# Patient Record
Sex: Female | Born: 1970
Health system: Southern US, Community
[De-identification: ages and names within clinical notes are randomized; demographics above are authoritative.]

## PROBLEM LIST (undated history)

## (undated) DIAGNOSIS — G43509 Persistent migraine aura without cerebral infarction, not intractable, without status migrainosus: Secondary | ICD-10-CM

## (undated) DIAGNOSIS — C73 Malignant neoplasm of thyroid gland: Secondary | ICD-10-CM

## (undated) HISTORY — DX: Persistent migraine aura without cerebral infarction, not intractable, without status migrainosus: G43.509

## (undated) HISTORY — DX: Malignant neoplasm of thyroid gland: C73

---
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
## 1997-10-25 ENCOUNTER — Other Ambulatory Visit: Admission: RE | Admit: 1997-10-25 | Discharge: 1997-10-25 | Payer: Self-pay | Admitting: Obstetrics and Gynecology

## 1998-11-05 ENCOUNTER — Other Ambulatory Visit: Admission: RE | Admit: 1998-11-05 | Discharge: 1998-11-05 | Payer: Self-pay | Admitting: Obstetrics and Gynecology

## 1999-11-18 ENCOUNTER — Other Ambulatory Visit: Admission: RE | Admit: 1999-11-18 | Discharge: 1999-11-18 | Payer: Self-pay | Admitting: Obstetrics and Gynecology

## 2000-11-23 ENCOUNTER — Other Ambulatory Visit: Admission: RE | Admit: 2000-11-23 | Discharge: 2000-11-23 | Payer: Self-pay | Admitting: Obstetrics and Gynecology

## 2001-08-20 ENCOUNTER — Encounter: Admission: RE | Admit: 2001-08-20 | Discharge: 2001-08-20 | Payer: Self-pay | Admitting: Internal Medicine

## 2001-08-20 ENCOUNTER — Encounter: Payer: Self-pay | Admitting: Internal Medicine

## 2002-06-29 ENCOUNTER — Encounter: Admission: RE | Admit: 2002-06-29 | Discharge: 2002-06-29 | Payer: Self-pay | Admitting: Internal Medicine

## 2002-06-29 ENCOUNTER — Encounter: Payer: Self-pay | Admitting: Internal Medicine

## 2004-04-03 ENCOUNTER — Ambulatory Visit: Payer: Self-pay | Admitting: Internal Medicine

## 2004-08-21 ENCOUNTER — Ambulatory Visit: Payer: Self-pay | Admitting: Internal Medicine

## 2005-03-01 ENCOUNTER — Emergency Department (HOSPITAL_COMMUNITY): Admission: EM | Admit: 2005-03-01 | Discharge: 2005-03-01 | Payer: Self-pay | Admitting: Family Medicine

## 2006-04-02 ENCOUNTER — Inpatient Hospital Stay (HOSPITAL_COMMUNITY): Admission: AD | Admit: 2006-04-02 | Discharge: 2006-04-02 | Payer: Self-pay | Admitting: Internal Medicine

## 2006-05-22 ENCOUNTER — Emergency Department (HOSPITAL_COMMUNITY): Admission: EM | Admit: 2006-05-22 | Discharge: 2006-05-22 | Payer: Self-pay | Admitting: Family Medicine

## 2006-06-30 ENCOUNTER — Emergency Department (HOSPITAL_COMMUNITY): Admission: EM | Admit: 2006-06-30 | Discharge: 2006-06-30 | Payer: Self-pay | Admitting: Family Medicine

## 2006-07-24 ENCOUNTER — Ambulatory Visit: Payer: Self-pay | Admitting: Internal Medicine

## 2006-09-17 ENCOUNTER — Inpatient Hospital Stay (HOSPITAL_COMMUNITY): Admission: AD | Admit: 2006-09-17 | Discharge: 2006-09-21 | Payer: Self-pay | Admitting: *Deleted

## 2006-10-05 ENCOUNTER — Encounter: Payer: Self-pay | Admitting: Internal Medicine

## 2007-02-09 ENCOUNTER — Ambulatory Visit: Payer: Self-pay | Admitting: Internal Medicine

## 2007-02-09 DIAGNOSIS — J019 Acute sinusitis, unspecified: Secondary | ICD-10-CM | POA: Insufficient documentation

## 2007-02-09 DIAGNOSIS — H60509 Unspecified acute noninfective otitis externa, unspecified ear: Secondary | ICD-10-CM | POA: Insufficient documentation

## 2007-05-20 HISTORY — PX: THYROIDECTOMY: SHX17

## 2007-06-03 ENCOUNTER — Ambulatory Visit: Payer: Self-pay | Admitting: Internal Medicine

## 2007-06-03 DIAGNOSIS — E041 Nontoxic single thyroid nodule: Secondary | ICD-10-CM | POA: Insufficient documentation

## 2007-06-11 ENCOUNTER — Encounter: Admission: RE | Admit: 2007-06-11 | Discharge: 2007-06-11 | Payer: Self-pay | Admitting: Internal Medicine

## 2007-06-15 ENCOUNTER — Encounter (INDEPENDENT_AMBULATORY_CARE_PROVIDER_SITE_OTHER): Payer: Self-pay | Admitting: *Deleted

## 2007-07-01 ENCOUNTER — Ambulatory Visit: Payer: Self-pay | Admitting: Internal Medicine

## 2007-07-01 DIAGNOSIS — G43909 Migraine, unspecified, not intractable, without status migrainosus: Secondary | ICD-10-CM | POA: Insufficient documentation

## 2007-07-01 DIAGNOSIS — R5381 Other malaise: Secondary | ICD-10-CM | POA: Insufficient documentation

## 2007-07-01 DIAGNOSIS — R5383 Other fatigue: Secondary | ICD-10-CM

## 2007-07-01 DIAGNOSIS — I951 Orthostatic hypotension: Secondary | ICD-10-CM | POA: Insufficient documentation

## 2007-07-01 DIAGNOSIS — E162 Hypoglycemia, unspecified: Secondary | ICD-10-CM | POA: Insufficient documentation

## 2007-07-02 ENCOUNTER — Ambulatory Visit: Payer: Self-pay | Admitting: Internal Medicine

## 2007-07-03 LAB — CONVERTED CEMR LAB
AST: 17 units/L (ref 0–37)
Albumin: 3.8 g/dL (ref 3.5–5.2)
Basophils Absolute: 0 10*3/uL (ref 0.0–0.1)
Chloride: 108 meq/L (ref 96–112)
Creatinine, Ser: 0.9 mg/dL (ref 0.4–1.2)
Eosinophils Relative: 1.3 % (ref 0.0–5.0)
Glucose, Bld: 86 mg/dL (ref 70–99)
HCT: 38.5 % (ref 36.0–46.0)
Hgb A1c MFr Bld: 5.5 % (ref 4.6–6.0)
Neutrophils Relative %: 45.3 % (ref 43.0–77.0)
RBC: 4.22 M/uL (ref 3.87–5.11)
RDW: 12.3 % (ref 11.5–14.6)
Sodium: 141 meq/L (ref 135–145)
Total Bilirubin: 0.7 mg/dL (ref 0.3–1.2)
WBC: 7.7 10*3/uL (ref 4.5–10.5)

## 2007-07-05 ENCOUNTER — Encounter (INDEPENDENT_AMBULATORY_CARE_PROVIDER_SITE_OTHER): Payer: Self-pay | Admitting: *Deleted

## 2007-07-08 ENCOUNTER — Encounter: Admission: RE | Admit: 2007-07-08 | Discharge: 2007-07-08 | Payer: Self-pay | Admitting: Internal Medicine

## 2007-07-12 ENCOUNTER — Encounter (INDEPENDENT_AMBULATORY_CARE_PROVIDER_SITE_OTHER): Payer: Self-pay | Admitting: *Deleted

## 2007-07-13 ENCOUNTER — Telehealth (INDEPENDENT_AMBULATORY_CARE_PROVIDER_SITE_OTHER): Payer: Self-pay | Admitting: *Deleted

## 2007-07-20 ENCOUNTER — Encounter: Payer: Self-pay | Admitting: Internal Medicine

## 2007-08-18 ENCOUNTER — Encounter (INDEPENDENT_AMBULATORY_CARE_PROVIDER_SITE_OTHER): Payer: Self-pay | Admitting: Surgery

## 2007-08-18 ENCOUNTER — Ambulatory Visit (HOSPITAL_COMMUNITY): Admission: RE | Admit: 2007-08-18 | Discharge: 2007-08-19 | Payer: Self-pay | Admitting: Surgery

## 2007-08-23 ENCOUNTER — Telehealth (INDEPENDENT_AMBULATORY_CARE_PROVIDER_SITE_OTHER): Payer: Self-pay | Admitting: *Deleted

## 2007-09-14 ENCOUNTER — Encounter: Admission: RE | Admit: 2007-09-14 | Discharge: 2007-09-14 | Payer: Self-pay | Admitting: Endocrinology

## 2007-09-15 ENCOUNTER — Encounter: Payer: Self-pay | Admitting: Internal Medicine

## 2007-09-21 ENCOUNTER — Encounter: Admission: RE | Admit: 2007-09-21 | Discharge: 2007-09-21 | Payer: Self-pay | Admitting: Endocrinology

## 2007-09-28 ENCOUNTER — Encounter: Admission: RE | Admit: 2007-09-28 | Discharge: 2007-09-28 | Payer: Self-pay | Admitting: Endocrinology

## 2007-10-26 ENCOUNTER — Encounter: Payer: Self-pay | Admitting: Internal Medicine

## 2008-04-10 ENCOUNTER — Ambulatory Visit: Payer: Self-pay | Admitting: Internal Medicine

## 2008-05-08 ENCOUNTER — Encounter: Payer: Self-pay | Admitting: Internal Medicine

## 2008-07-06 ENCOUNTER — Ambulatory Visit: Payer: Self-pay | Admitting: Family Medicine

## 2009-02-05 ENCOUNTER — Encounter (HOSPITAL_COMMUNITY): Admission: RE | Admit: 2009-02-05 | Discharge: 2009-02-09 | Payer: Self-pay | Admitting: Endocrinology

## 2009-07-11 ENCOUNTER — Telehealth: Payer: Self-pay | Admitting: Internal Medicine

## 2009-10-02 ENCOUNTER — Ambulatory Visit: Payer: Self-pay | Admitting: Nurse Practitioner

## 2009-12-04 ENCOUNTER — Ambulatory Visit: Payer: Self-pay | Admitting: Nurse Practitioner

## 2010-03-12 ENCOUNTER — Ambulatory Visit: Payer: Self-pay | Admitting: Nurse Practitioner

## 2010-04-24 ENCOUNTER — Ambulatory Visit: Payer: Self-pay | Admitting: Internal Medicine

## 2010-06-20 NOTE — Progress Notes (Signed)
Summary: new med  Phone Note Call from Patient Call back at Home Phone [phone]   Caller: Patient Summary of Call: pt states that she saw her psychologists Milford Cage [phone](cell) who suggest that she contact dr Jasmine Maceachern to have him rx wellbutrin XL 150mg . per pt if dr Lielle Vandervort has any question please contact Britta Mccreedy at above number. pt uses [facility]. pls advise ok with rx this med or would you prefer to see pt first..................Marland KitchenFelecia Deloach CMA  July 11, 2009 10:13 AM  Initial call taken by: Jeremy Johann CMA,  July 11, 2009 10:09 AM  Follow-up for Phone Call        Generic OK #30, RX2 Follow-up by: Marga Melnick MD,  July 11, 2009 2:44 PM  Additional Follow-up for Phone Call Additional follow up Details #1::        left pt detail message rx sent to pharmacy.......Marland KitchenFelecia Deloach CMA  July 11, 2009 2:59 PM     New/Updated Medications: WELLBUTRIN XL 150 MG XR24H-TAB (BUPROPION HCL) Take 1 tab once daily Prescriptions: WELLBUTRIN XL 150 MG XR24H-TAB (BUPROPION HCL) Take 1 tab once daily  #30 x 2   Entered by:   Jeremy Johann CMA   Authorized by:   Marga Melnick MD   Signed by:   Jeremy Johann CMA on 07/11/2009   Method used:   Faxed to ...       [facility] (retail)       [address]       [place], [place]  [address]       Ph: [phone] or [phone]       Fax: [phone]   RxID:   [id number]

## 2010-06-20 NOTE — Assessment & Plan Note (Signed)
Summary: congested/cbs   Vital Signs:  Patient profile:   40 year old female Height:      68 inches Weight:      175.4 pounds BMI:     26.77 Temp:     98.7 degrees F oral Pulse rate:   76 / minute Resp:     16 per minute BP sitting:   110 / 68  (left arm) Cuff size:   large  Vitals Entered By: Shonna Chock CMA (April 24, 2010 9:27 AM) CC: X 3 days- congestion, patient states "It feels like something is sitting on my face." Patient with frequent travel with in the past week ( on about 8 different planes) , URI symptoms   CC:  X 3 days- congestion, patient states "It feels like something is sitting on my face." Patient with frequent travel with in the past week ( on about 8 different planes) , and URI symptoms.  History of Present Illness:      This is a 40 year old woman who presents with  RTI  symptoms; onset 12/3 as congestion.  The patient now reports nasal congestion, purulent nasal discharge, and productive cough, but denies sore throat and earache ( but she has pressure especially with frequent flights).  Associated symptoms include wheezing climbing stairs.  The patient denies fever.  The patient also reports frontal  headache.  & bilateral facial pain and tooth pain.  The patient denies the following risk factors for Strep sinusitis: tender adenopathy.  Rx: Sudafed, Mucinex, Neti pot   Current Medications (verified): 1)  Topamax 200 Mg Tabs (Topiramate) .Marland Kitchen.. 1 By Mouth Once Daily 2)  Seasonique 0.15-0.03 &0.01 Mg Tabs (Levonorgest-Eth Estrad 91-Day) .Marland Kitchen.. 1 By Mouth Once Daily 3)  Synthroid 175 Mcg Tabs (Levothyroxine Sodium) .Marland Kitchen.. 1 By Mouth Once Daily 4)  Wellbutrin Xl 150 Mg Xr24h-Tab (Bupropion Hcl) .... Take 1 Tab Once Daily 5)  Treximet 85-500 Mg Tabs (Sumatriptan-Naproxen Sodium) .... As Needed For Migraines  Allergies: 1)  ! Percocet  Physical Exam  General:  Appears tired but in no acute distress; alert,appropriate and cooperative throughout examination Ears:   External ear exam shows no significant lesions or deformities.  Otoscopic examination reveals clear canals, tympanic membranes are intact bilaterally without bulging, retraction, inflammation or discharge. Hearing is grossly normal bilaterally. Nose:  External nasal examination shows no deformity or inflammation. Nasal mucosa are pink and moist without lesions or exudates.Repeatedly sniffling Mouth:  Oral mucosa and oropharynx without lesions or exudates.  Teeth in good repair. Lungs:  Normal respiratory effort, chest expands symmetrically. Lungs are clear to auscultation, no crackles or wheezes. Cervical Nodes:  No lymphadenopathy noted Axillary Nodes:  No palpable lymphadenopathy   Impression & Recommendations:  Problem # 1:  BRONCHITIS-ACUTE (ICD-466.0)  Her updated medication list for this problem includes:    Amoxicillin-pot Clavulanate 875-125 Mg Tabs (Amoxicillin-pot clavulanate) .Marland Kitchen... 1 every 12 hrs with a meal  Problem # 2:  SINUSITIS- ACUTE-NOS (ICD-461.9)  The following medications were removed from the medication list:    Nasonex 50 Mcg/act Susp (Mometasone furoate) .Marland Kitchen... 2 sprays each nostril once daily Her updated medication list for this problem includes:    Amoxicillin-pot Clavulanate 875-125 Mg Tabs (Amoxicillin-pot clavulanate) .Marland Kitchen... 1 every 12 hrs with a meal    Fluticasone Propionate 50 Mcg/act Susp (Fluticasone propionate) .Marland Kitchen... 1 spray two times a day as needed  Complete Medication List: 1)  Topamax 200 Mg Tabs (Topiramate) .Marland Kitchen.. 1 by mouth once daily 2)  Seasonique 0.15-0.03 &0.01 Mg Tabs (Levonorgest-eth estrad 91-day) .Marland Kitchen.. 1 by mouth once daily 3)  Synthroid 175 Mcg Tabs (Levothyroxine sodium) .Marland Kitchen.. 1 by mouth once daily 4)  Wellbutrin Xl 150 Mg Xr24h-tab (Bupropion hcl) .... Take 1 tab once daily 5)  Treximet 85-500 Mg Tabs (Sumatriptan-naproxen sodium) .... As needed for migraines 6)  Amoxicillin-pot Clavulanate 875-125 Mg Tabs (Amoxicillin-pot clavulanate) .Marland Kitchen..  1 every 12 hrs with a meal 7)  Fluticasone Propionate 50 Mcg/act Susp (Fluticasone propionate) .Marland Kitchen.. 1 spray two times a day as needed  Patient Instructions: 1)  Neti pot once daily two times a day as needed until clear. 2)  Drink as much NON dairy fluid as you can tolerate for the next few days. Prescriptions: FLUTICASONE PROPIONATE 50 MCG/ACT SUSP (FLUTICASONE PROPIONATE) 1 spray two times a day as needed  #1 x 11   Entered and Authorized by:   Marga Melnick MD   Signed by:   Marga Melnick MD on 04/24/2010   Method used:   Faxed to ...       [facility] (retail)       [address]       [place], [place]  [address]       Ph: [phone] or [phone]       Fax: [phone]   RxID:   [id number] AMOXICILLIN-POT CLAVULANATE 875-125 MG TABS (AMOXICILLIN-POT CLAVULANATE) 1 every 12 hrs with a meal  #20 x 0   Entered and Authorized by:   Marga Melnick MD   Signed by:   Marga Melnick MD on 04/24/2010   Method used:   Faxed to ...       [facility] (retail)       [address]       [place], [place]  [address]       Ph: [phone] or [phone]       Fax: [phone]   RxID:   [id number]    Orders Added: 1)  Est. Patient Level III [50093]

## 2010-07-16 ENCOUNTER — Encounter: Payer: Self-pay | Admitting: Internal Medicine

## 2010-07-16 ENCOUNTER — Ambulatory Visit (INDEPENDENT_AMBULATORY_CARE_PROVIDER_SITE_OTHER): Payer: BC Managed Care – PPO | Admitting: Internal Medicine

## 2010-07-16 DIAGNOSIS — J069 Acute upper respiratory infection, unspecified: Secondary | ICD-10-CM

## 2010-07-16 DIAGNOSIS — J209 Acute bronchitis, unspecified: Secondary | ICD-10-CM

## 2010-07-19 ENCOUNTER — Telehealth (INDEPENDENT_AMBULATORY_CARE_PROVIDER_SITE_OTHER): Payer: Self-pay | Admitting: *Deleted

## 2010-07-24 ENCOUNTER — Telehealth (INDEPENDENT_AMBULATORY_CARE_PROVIDER_SITE_OTHER): Payer: Self-pay | Admitting: *Deleted

## 2010-07-25 NOTE — Progress Notes (Signed)
Summary: med for cough  Phone Note Call from Patient Call back at Home Phone [phone] [facility]     Caller: Patient Summary of Call: Pt calling was seen 07/16/10 would like to have something called into pharmacy for cough has been up all night otc meds not helping.  pharmacy [facility] Initial call taken by: Doristine Devoid CMA,  July 19, 2010 12:50 PM  Follow-up for Phone Call        allergic to Percocet ; therefore generic Tessalon 200q 6-8 hrs as needed #20 & Prednisone 20 mg two times a day with meals #10 Follow-up by: Marga Melnick MD,  July 19, 2010 12:59 PM  Additional Follow-up for Phone Call Additional follow up Details #1::        spoke w/ patient aware prescription sent to pharmacy .Marland KitchenMarland KitchenMarland KitchenDoristine Devoid CMA  July 19, 2010 3:42 PM     New/Updated Medications: TESSALON 200 MG CAPS (BENZONATATE) take one every 6-8 hours as needed for cough PREDNISONE 20 MG TABS (PREDNISONE) take one two times a day with meals Prescriptions: PREDNISONE 20 MG TABS (PREDNISONE) take one two times a day with meals  #10 x 0   Entered by:   Doristine Devoid CMA   Authorized by:   Marga Melnick MD   Signed by:   Doristine Devoid CMA on 07/19/2010   Method used:   Electronically to        [facility] (retail)       [address]       [place], [place]  [address]       Ph: [phone] or [phone]       Fax: [phone]   RxID:   [id number] TESSALON 200 MG CAPS (BENZONATATE) take one every 6-8 hours as needed for cough  #20 x 0   Entered by:   Doristine Devoid CMA   Authorized by:   Marga Melnick MD   Signed by:   Doristine Devoid CMA on 07/19/2010   Method used:   Electronically to        [facility] (retail)       [address]       [place], [place]  [address]       Ph: [phone] or [phone]       Fax: [phone]   RxID:   [id number]

## 2010-07-25 NOTE — Progress Notes (Signed)
Summary: Triage: Coughed all night  Phone Note Call from Patient Call back at Home Phone [phone] P PH     Caller: Patient Summary of Call: Patient left message on voicemail stating she needs something else for cough because she coughed so much last night she is completely exhausted.   Per Dr.Hopper with percocet allergy unable to rx other cough suppresants.   I asked patient what was her reaction to percocet and she stated: "It made me throw up but im willing to try something containing the same ingredients because Im exhausted from coughing and I need something."  Per Dr.Hopper we will rx Hydromet   New Allergies: ! PERCOCET New/Updated Medications: HYDROMET 5-1.5 MG/5ML SYRP (HYDROCODONE-HOMATROPINE) 1 teaspoon every 6 hours as needed New Allergies: ! PERCOCETPrescriptions: HYDROMET 5-1.5 MG/5ML SYRP (HYDROCODONE-HOMATROPINE) 1 teaspoon every 6 hours as needed  #120cc x 0   Entered by:   Shonna Chock CMA   Authorized by:   Marga Melnick MD   Signed by:   Shonna Chock CMA on 07/19/2010   Method used:   Printed then faxed to ...       [facility] (retail)       [address]       [place], [place]  [address]       Ph: [phone] or [phone]       Fax: [phone]   RxID:   [id number]

## 2010-07-25 NOTE — Assessment & Plan Note (Signed)
Summary: COUGH FOR OVER 10 DAYS///SPH   Vital Signs:  Patient profile:   40 year old female Height:      68 inches (172.72 cm) Weight:      180.38 pounds (81.99 kg) BMI:     27.53 Temp:     98.2 degrees F (36.78 degrees C) oral Resp:     15 per minute BP sitting:   110 / 50  (left arm) Cuff size:   large  Vitals Entered By: Lucious Groves CMA (July 16, 2010 12:17 PM) CC: C/O cough and upper respiratory symptoms for 3 weeks./kb, URI symptoms Comments Patient notes that she has been having fatigue, congestion, cough with dark green mucous production, and HA. She denies fever and chest pain.   CC:  C/O cough and upper respiratory symptoms for 3 weeks./kb and URI symptoms.  History of Present Illness:    Onset 3 weeks ago as malaise & head congestion. She now  reports nasal congestion and productive cough, but denies purulent nasal discharge and earache.  Associated symptoms include dyspnea and wheezing.  The patient denies fever.  The patient denies frontal  headache.  Risk factors for Strep sinusitis include unilateral facial pain.  The patient denies the following risk factors for Strep sinusitis: tooth pain and tender adenopathy.  Rx: Neti pot, Mucinex  Current Medications (verified): 1)  Topamax 200 Mg Tabs (Topiramate) .Marland Kitchen.. 1 By Mouth Once Daily 2)  Seasonique 0.15-0.03 &0.01 Mg Tabs (Levonorgest-Eth Estrad 91-Day) .Marland Kitchen.. 1 By Mouth Once Daily 3)  Synthroid 175 Mcg Tabs (Levothyroxine Sodium) .Marland Kitchen.. 1 By Mouth Once Daily 4)  Treximet 85-500 Mg Tabs (Sumatriptan-Naproxen Sodium) .... As Needed For Migraines 5)  Amoxicillin-Pot Clavulanate 875-125 Mg Tabs (Amoxicillin-Pot Clavulanate) .Marland Kitchen.. 1 Every 12 Hrs With A Meal  Allergies (verified): 1)  ! Percocet  Physical Exam  General:  well-nourished,in no acute distress; alert,appropriate and cooperative throughout examination Ears:  External ear exam shows no significant lesions or deformities.  Otoscopic examination reveals clear  canals, tympanic membranes are intact bilaterally without bulging, retraction, inflammation or discharge. Hearing is grossly normal bilaterally. Nose:  External nasal examination shows no deformity or inflammation. Nasal mucosa are  dry  without lesions or exudates. Hyponasal speech Mouth:  Oral mucosa and oropharynx without lesions or exudates.  Teeth in good repair. Neck:  No deformities, masses, or tenderness noted. Thyroid absent Lungs:  Normal respiratory effort, chest expands symmetrically. Lungs : minimal rhonchi; no  wheezes. Cervical Nodes:  No lymphadenopathy noted Axillary Nodes:  No palpable lymphadenopathy   Impression & Recommendations:  Problem # 1:  BRONCHITIS-ACUTE (ICD-466.0)  Her updated medication list for this problem includes:    Amoxicillin-pot Clavulanate 875-125 Mg Tabs (Amoxicillin-pot clavulanate) .Marland Kitchen... 1 every 12 hrs with a meal    Cefuroxime Axetil 500 Mg Tabs (Cefuroxime axetil) .Marland Kitchen... 1 two times a day  Problem # 2:  URI (ICD-465.9)  Complete Medication List: 1)  Topamax 200 Mg Tabs (Topiramate) .Marland Kitchen.. 1 by mouth once daily 2)  Seasonique 0.15-0.03 &0.01 Mg Tabs (Levonorgest-eth estrad 91-day) .Marland Kitchen.. 1 by mouth once daily 3)  Synthroid 175 Mcg Tabs (Levothyroxine sodium) .Marland Kitchen.. 1 by mouth once daily 4)  Treximet 85-500 Mg Tabs (Sumatriptan-naproxen sodium) .... As needed for migraines 5)  Amoxicillin-pot Clavulanate 875-125 Mg Tabs (Amoxicillin-pot clavulanate) .Marland Kitchen.. 1 every 12 hrs with a meal 6)  Cefuroxime Axetil 500 Mg Tabs (Cefuroxime axetil) .Marland Kitchen.. 1 two times a day 7)  Fluticasone Propionate 50 Mcg/act Susp (Fluticasone propionate) .Marland Kitchen.. 1 spray  two times a day  Patient Instructions: 1)  Drink as much  non DAIRY fluid as you can tolerate for the next few days. Neti pot once daily two times a day as needed . Prescriptions: FLUTICASONE PROPIONATE 50 MCG/ACT SUSP (FLUTICASONE PROPIONATE) 1 spray two times a day  #1 x 11   Entered and Authorized by:   Marga Melnick MD   Signed by:   Marga Melnick MD on 07/16/2010   Method used:   Electronically to        [facility] (retail)       [address]       [place], [place]  [address]       Ph: [phone] or [phone]       Fax: [phone]   RxID:   [id number] CEFUROXIME AXETIL 500 MG TABS (CEFUROXIME AXETIL) 1 two times a day  #20 x 0   Entered and Authorized by:   Marga Melnick MD   Signed by:   Marga Melnick MD on 07/16/2010   Method used:   Electronically to        [facility] (retail)       [address]       [place], [place]  [id number]       Ph: [phone] or [phone]       Fax: [phone]   RxID:   [id number]    Orders Added: 1)  Est. Patient Level III [[address]678]

## 2010-07-30 NOTE — Progress Notes (Signed)
Summary: refill  Phone Note Refill Request Message from:  Fax from Pharmacy on July 24, 2010 1:55 PM  Refills Requested: Medication #1:  HYDROMET 5-1.5 MG/5ML SYRP 1 teaspoon every 6 hours as needed. [facility] [address] - fax [phone]  Initial call taken by: Okey Regal [place],  July 24, 2010 1:56 PM  Follow-up for Phone Call        Dr.Hopper please advise Follow-up by: Shonna Chock CMA,  July 24, 2010 2:03 PM  Additional Follow-up for Phone Call Additional follow up Details #1::        OK X 1 Additional Follow-up by: Marga Melnick MD,  July 25, 2010 4:52 AM    Prescriptions: HYDROMET 5-1.5 MG/5ML SYRP (HYDROCODONE-HOMATROPINE) 1 teaspoon every 6 hours as needed  #120cc x 0   Entered by:   Shonna Chock CMA   Authorized by:   Marga Melnick MD   Signed by:   Shonna Chock CMA on 07/25/2010   Method used:   Printed then faxed to ...       [facility] (retail)       [address]       [place], [place]  [address]       Ph: [phone] or [phone]       Fax: [phone]   RxID:   [id number]

## 2010-09-03 ENCOUNTER — Encounter: Payer: BC Managed Care – PPO | Admitting: Nurse Practitioner

## 2010-09-03 DIAGNOSIS — G43109 Migraine with aura, not intractable, without status migrainosus: Secondary | ICD-10-CM

## 2010-09-04 NOTE — Assessment & Plan Note (Unsigned)
NAMEKELLY, RANIERI NO.:  [id number]  MEDICAL RECORD NO.:  [id number]           PATIENT TYPE:  LOCATION:  [facility] at [facility]           FACILITY:  PHYSICIAN:  Catalina Antigua, MD     DATE OF BIRTH:  [birth year]  DATE OF SERVICE:  09/03/2010                                 CLINIC NOTE  The patient comes to the office today for followup on her migraine headaches.  The patient was last seen approximately 6 months ago.  Since that time, she has actually been doing very well with her headaches. She has been skipping her placebo on her Seasonique and not been having any menstrual cycle and that has been helping.  She has changed doctors from Dr. Clearance Coots and is having someone else follow her for her thyroid problems.  PHYSICAL EXAMINATION:  VITAL SIGNS:  Blood pressure is 104/70, weight is 184, height is 5 feet 8 inches, pulse is 77. GENERAL:  A well-developed, well-nourished Caucasian female, in no acute distress. HEENT:  Head is normocephalic and atraumatic.  Pupils equal and reactive. NEUROLOGIC:  The patient is alert and oriented.  Good muscle tone, good coordination, and good sensation.  ASSESSMENT:  Migraine headache.  PLAN:  We will refill her medications, Topamax, Treximet, and Vicodin for rescue and prednisone for rescue.  She will return to the office in 1 year or sooner as needed.     Remonia Richter, NP   ______________________________ Catalina Antigua, MD   LR/MEDQ  D:  09/03/2010  T:  09/04/2010  Job:  [id number]
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
## 2010-10-01 NOTE — Assessment & Plan Note (Signed)
Sheila Curtis, Sheila Curtis NO.:  [id number]   MEDICAL RECORD NO.:  [id number]          PATIENT TYPE:  POB   LOCATION:  [facility] at [facility]         FACILITY:  [facility]   PHYSICIAN:  Jaynie Collins, MD     DATE OF BIRTH:  20-Aug-1970   DATE OF SERVICE:  10/02/2009                                  CLINIC NOTE   The patient comes to the office today for consultation on her migraine  headache.  The patient is well-known to me from the [facility].  She has been seen at the [facility] for 10+  years.  She has recently become unhappy with her care there and would  like to switch.  She did have a baby in 2008, following that had some  difficulties and was diagnosed with thyroid cancer in 2009.  She has had  some trouble with her weight.  She does see Dr. Talmage Nap for Endocrinology.  Her last TSH was in the 1 range which she is happy with.  She currently  has been experiencing migraines since she was 40 years old.  She does  not have aura.  Her headaches are usually in one temple or the other.  She has a unique headache pattern that includes having 1-2 prolonged  headaches per month that lasts 3-4 days.  These wax and wane from  serious to mild to moderate.  She was recently put on Zonegran 100 mg  for headache prevention.  She had been on Topamax in the past prior to  being pregnant and she was not sure why she did not put back on that  after delivery.  She is currently on Wellbutrin 150 mg for depression.  When she gets a headache, she has been taking the needleless sumatriptan  injection, Imitrex tablets, DHE nasal spray, and Flexeril.  She  recognizes that her triggers are stress.  She has a full-time job and a  68-year-old.  Her husband is very helpful around the house and she does  have good support there.  She also recognizes smells and alcohol can  trigger a migraine.  She is sleeping very well.  She has been using  Seasonique for her menstrual cycle  control and has been on that for  years.   OBSTETRICAL HISTORY:  G1, P1.   SURGICAL HISTORY:  In 1992, breast reduction.  In 2008, C-section.  In  2009, thyroidectomy.   FAMILY HISTORY:  Diabetes, grandmother.  High blood pressure, mother.  Cancer, grandmother.   PERSONAL MEDICAL HISTORY:  Thyroid problems, thyroid cancer, and  migraine headaches.   SOCIAL HISTORY:  Lives with her husband and 54-year-old.  The patient  does work outside of the home and travels.  She does drink alcohol  socially.  She has 1 caffeinated beverage per day.  She has not been  sexually abused or physically abused.  She does not use abusive  substances.   REVIEW OF SYSTEMS:  Positive for fever, fatigue, weight gain, frequent  headaches.   PHYSICAL EXAMINATION:  GENERAL:  Well-developed, well-nourished, 40-year-  old Caucasian female in no acute distress.  HEENT:  Head is normocephalic and atraumatic.  Pupils are equal and  reactive.  NEUROLOGIC:  The patient is alert, oriented.  She is somewhat anxious.  She has good muscle coordination, good tone.  CARDIAC:  Regular rate and rhythm.  LUNGS:  Clear bilaterally.  EXTREMITIES:  Warm and dry.   ASSESSMENT:  Migraine without aura.   PLAN:  We did review what medications that she has been on in the past,  but she would like to return to Topamax.  She is asked to discontinue  the Zonegran 100 mg.  She can start the Topamax 100 mg 1 p.o. nightly  #90 with 1 refill and she may start this tonight.  She is also given  rescue in the form of Vicodin ES 1 p.o. q.6 h. p.r.n. pain #30 with 1  refill.  She can also use Phenergan 25 mg one p.o. q.6 h. p.r.n. #30  with 1 refill or Zofran 8 mg one p.o. p.r.n. q.6 h. #30 with 1 refill.  The patient is very good at manipulating her medications and with her  situation, she will continue to do so.  She will return to the office in  8 weeks or sooner as needed.      Remonia Richter, NP     ______________________________  Jaynie Collins, MD    LR/MEDQ  D:  10/02/2009  T:  10/03/2009  Job:  [id number]

## 2010-10-01 NOTE — Assessment & Plan Note (Signed)
NAMETYNIKA, LUDDY NO.:  [id number]   MEDICAL RECORD NO.:  [id number]          PATIENT TYPE:  POB   LOCATION:  [facility] at [facility]         FACILITY:  [facility]   PHYSICIAN:  Allie Bossier, MD        DATE OF BIRTH:  07/03/1970   DATE OF SERVICE:  03/12/2010                                  CLINIC NOTE   The patient comes to office today for followup on her migraine  headaches.  The patient was last seen in July of 2011.  Since then she  did take her prednisone one-time and that worked well for her.  She is  currently up to 200 mg on her Topamax and needs to have that refilled.  Otherwise, she is doing very well.  Her recent TSH was 16.  They did  adjust her Synthroid.  She is requesting refill on her Vicodin.   PHYSICAL EXAMINATION:  GENERAL:  A well-developed, well-nourished 40-  year-old Caucasian female.  VITAL SIGNS:  Blood pressure is 104/69, pulse is 67, weight is 178,  height is 5 feet 8 inches.  NEUROLOGICALLY:  Alert, oriented.  Good muscle tone sensation.   ASSESSMENT:  Migraine.   PLAN:  New prescription for Topamax 200 mg 1 p.o. q.h.s. #90 with 4  refills, Vicodin DS 1 p.o. q.4 hours p.r.n. pain #30 with no refills.  Patient will return to the clinic in 6 months or sooner as need be.      Remonia Richter, NP    ______________________________  Allie Bossier, MD    LR/MEDQ  D:  03/12/2010  T:  03/13/2010  Job:  [id number]

## 2010-10-01 NOTE — Op Note (Signed)
Sheila Curtis, Sheila Curtis NO.:  [id number]   MEDICAL RECORD NO.:  [id number]          PATIENT TYPE:  OIB   LOCATION:  5123                         FACILITY:  [facility]   PHYSICIAN:  Velora Heckler, MD      DATE OF BIRTH:  Nov 08, 1970   DATE OF PROCEDURE:  08/18/2007  DATE OF DISCHARGE:                               OPERATIVE REPORT   PREOPERATIVE DIAGNOSIS:  Right thyroid nodule.   POSTOPERATIVE DIAGNOSIS:  Papillary thyroid carcinoma.   PROCEDURE:  1. Total thyroidectomy  2. Central compartment lymph node dissection (Zone VI).   SURGEON:  Velora Heckler, MD, FACS   ANESTHESIA:  General per Guadalupe Maple, MD   ESTIMATED BLOOD LOSS:  Minimal.   PREPARATION:  Betadine.   COMPLICATIONS:  None.   INDICATIONS:  The patient is a 40 year old white female from [place],  [place].  She has been followed by Dr. Lona Kettle for a number  of years.  She was noted to have a small nodule in the inferior right  thyroid lobe.  On sequential ultrasound this showed a slow increase in  size.  Ultrasound January 2009 showed a significant increase to 2.2 cm.  There were calcifications noted.  Nuclear medicine scanning showed this  to be a cold nodule.  The patient presented to surgery and excision was  recommended.  The patient now comes to the operating room.   PROCEDURE IN DETAIL:  The procedure was done in OR #6 at the [facility].  The patient is brought to the operating room,  placed in supine position on the operating room table.  Following  administration of general anesthesia the patient is positioned and then  prepped and draped in the usual strict aseptic fashion.  After  ascertaining that an adequate level of anesthesia been achieved a Kocher  incision was made with a #15 blade.  Dissection was carried through  subcutaneous tissues and platysma.  Hemostasis was obtained with the  electrocautery.  Skin flaps were elevated cephalad and caudad  from the  thyroid notch to the sternal notch.  A Mahorner self-retaining retractor  was placed for exposure.  Strap muscles were incised in the midline.  Dissection was begun on the left.  The left lobe was exposed by  reflecting the strap muscles laterally.  Left thyroid lobe appears  grossly normal.  It is slightly thickened and prominent but there are no  dominant nor discrete masses.   Next we turned our attention to the right lobe.  Strap muscles were  again reflected laterally.  Right lobe was exposed.  It is gently  dissected out with a Barista.  Middle thyroid veins were  divided between medium Ligaclips with the Harmonic scalpel.  Superior  pole was gently dissected out.  Superior pole vessels were divided  between medium Ligaclips with the Harmonic scalpel.  Gland was rolled  anteriorly.  Inferior venous tributaries were divided between medium  Ligaclips with the Harmonic scalpel.  Parathyroid tissue is identified  and preserved.  Recurrent nerve was identified and preserved.  Branches  of the inferior thyroid  artery are divided between small Ligaclips with  the Harmonic scalpel.  Gland was rolled further anteriorly and the  ligament of Berry transected with the electrocautery.  The thyroid  appears densely adherent to the underlying trachea.  It is dissected off  using the electrocautery.  There are a few prominent lymph nodes over  the thyroid cartilage.  These were resected with the specimen.  Inferior  venous tributaries were ligated with 3-0 silk ties and divided with the  Harmonic scalpel.  The isthmus was mobilized across the midline and the  isthmus is transected at its junction with the left thyroid lobe with  the Harmonic scalpel.  The lesion in the inferior pole of the right lobe  measures approximately 1.5 cm by palpation.  It is submitted to  pathology where Dr. Jimmy Picket performed frozen section biopsy.  Dr.  Luisa Hart confirms papillary thyroid  carcinoma on frozen section.   Next we turned our attention back to the left thyroid lobe.  Again strap  muscles were reflected laterally.  Middle thyroid veins were divided  between medium Ligaclips with the Harmonic scalpel.  Superior pole  vessels were dissected out and divided between medium Ligaclips with the  Harmonic scalpel.  Inferior venous tributaries were divided between  medium Ligaclips with the Harmonic scalpel.  Gland was rolled  anteriorly.  Branches of the inferior thyroid artery are divided between  small Ligaclips with the Harmonic scalpel.  Recurrent nerve was  identified and preserved.  Parathyroid tissue was identified and  preserved.  Ligament of Allyson Sabal is transected with the electrocautery and  the gland is excised off the trachea.  It is submitted as left thyroid  lobe.   In the zone 6 space overlying the trachea there are a few prominent  palpable lymph nodes.  This area was then dissected out using the  electrocautery and Harmonic scalpel for hemostasis.  Prominent venous  tributaries were divided between medium Ligaclips with the Harmonic  scalpel.  Tissue is dissected out from below the thyroid notch to where  the isthmus of the thyroid had been and side-to-side from recurrent  nerve to recurrent nerve.  This tissue is submitted to pathology for  review.  Neck is irrigated with warm saline.  Surgicel was placed  throughout the operative field.  Strap muscles were reapproximated  midline with interrupted 3-0 Vicryl sutures.  Platysma was closed with  interrupted 3-0 Vicryl sutures.  Skin was closed with a running 4-0  Monocryl subcuticular suture.  Wound is washed and dried.  Benzoin and  Steri-Strips are applied.  Sterile dressings were applied.  The patient  is awakened from anesthesia and brought to the recovery room in stable  condition.  The patient tolerated the procedure well.      Velora Heckler, MD  Electronically Signed     TMG/MEDQ  D:   08/18/2007  T:  08/18/2007  Job:  [id number]   cc:   Titus Dubin. Alwyn Ren, MD,FACP,FCCP

## 2010-10-04 NOTE — Discharge Summary (Signed)
Sheila Curtis, Sheila Curtis NO.:  [id number]   MEDICAL RECORD NO.:  [id number]          PATIENT TYPE:  EMS   LOCATION:  URG                          FACILITY:  [facility]   PHYSICIAN:  Lenoard Aden, M.D.DATE OF BIRTH:  1971/03/19   DATE OF ADMISSION:  09/17/2006  DATE OF DISCHARGE:  09/21/2006                               DISCHARGE SUMMARY   HISTORY OF PRESENT ILLNESS:  The patient underwent an uncomplicated  primary C-section.  Postoperatively, tolerated a diet well.  Discharged  to home day #3.  Discharge teaching #1.  Tylox and prenatal vitamins  given.  Follow up in the office in 4-6 weeks.      Lenoard Aden, M.D.  Electronically Signed     RJT/MEDQ  D:  10/25/2006  T:  10/26/2006  Job:  [id number]

## 2010-10-04 NOTE — Op Note (Signed)
Sheila Curtis, Sheila Curtis         ACCOUNT NO.:  [id number]   MEDICAL RECORD NO.:  [id number]          PATIENT TYPE:  INP   LOCATION:  9174                          FACILITY:  [facility]   PHYSICIAN:  Lenoard Aden, M.D.DATE OF BIRTH:  19-Jan-1971   DATE OF PROCEDURE:  09/18/2006  DATE OF DISCHARGE:                               OPERATIVE REPORT   PREOPERATIVE DIAGNOSIS:  1. 40+ week intrauterine pregnancy.  2. Failure to descend.  3. Deep transverse arrest.  4. Presumed LGA.   POSTOPERATIVE DIAGNOSIS:  1. 40+ week intrauterine pregnancy.  2. Failure to descend.  3. Deep transverse arrest.  4. Presumed LGA.   PROCEDURE:  Primary low segment transverse cesarean section.   SURGEON:  Lenoard Aden, M.D.   ASSISTANT:  Marlinda Mike, C.N.M.   ANESTHESIA:  Epidural.   ESTIMATED BLOOD LOSS:  1200 mL.   COMPLICATIONS:  None.   DRAINS:  Foley.   COUNTS:  Correct.  The patient to recovery in good condition.   FINDINGS:  Full-term living female, right occiput transverse position, 7  pounds 15 ounces, Apgars 8/9, anterior placenta.  Normal tubes, normal  ovaries.  Two-layer uterine closure.   DESCRIPTION OF PROCEDURE:  After being apprised of risks of anesthesia,  infection, bleeding, injury to abdominal organs with need for repair,  delayed versus immediate complications to include bowel and bladder  injury, the patient was brought to the operating room where she was  administered dosing of her epidural anesthetic without complications,  prepped and draped in sterile fashion.  Foley catheter previously placed  after achieving adequate anesthesia, dilute Marcaine solution placed. A  Pfannenstiel skin incision made scalpel, carried down to fascia which  was nicked in the midline and opened transversely using Mayo scissors.  Rectus muscles dissected sharply midline, peritoneum entered bluntly.  Bladder blade placed.  Visceral peritoneum scored sharply off the lower  uterine  segment.  Kerr hysterotomy incision made. Incision extended  bluntly cephalad caudad direction.  An atraumatic delivery from occiput  transverse position full-term living female handed pediatricians  attendance.  Apgars 08/09.  Cord blood collected by the cord blood  collection team. Placenta was delivered manually intact, three-vessel  cord noted.  Uterus curetted using a dry lap pack, was atonic initially.  Methergine and increased dilute Pitocin solution administered with good  response. Incision then closed in two running imbricating layers of 0  Monocryl suture.  Bladder flap inspected, found be hemostatic.  Irrigation accomplished. Reinspection of the bladder flap and uterine  incision notes good hemostasis. Peritoneum and fascial layers were  inspected and then fascia is closed using a 0 Monocryl in continuous  running fashion.  The subcutaneous tissue reapproximated using 0 plain  suture.  Skin closed using staples.  The patient tolerates procedure  well and is transferred to recovery in good condition.      Lenoard Aden, M.D.  Electronically Signed     RJT/MEDQ  D:  09/18/2006  T:  09/18/2006  Job:  [id number]

## 2010-10-04 NOTE — Assessment & Plan Note (Signed)
[facility]                             PULMONARY OFFICE NOTE   Curtis, Sheila                  MRN:          [id number]  DATE:07/24/2006                            DOB:          [birth year]    PROBLEM:  Pulmonary/sleep medicine consultation note at the kind request  of Dr. Marina Gravel for this pregnant woman who is awake and short of  breath.  Her primary physician is Dr. Marga Melnick.   HISTORY:  She blames the sleep discomforts of pregnancy including  needing to sleep more off of her back and increased nasal congestion for  at least two episodes when she had awakened aware that she was not  breathing. She mentioned to Dr. Earlene Plater the next day and this appointment  was scheduled. Bed time is between 9 p.m. and 11 p.m., estimating 15 to  60 minutes of sleep latency and waking 4 times during the night before  finally up at 7 a.m. She thinks she has gained nearly 50 pounds with her  pregnancy which is now at 33 weeks. This is her first pregnancy. She  does not know of a prior history of respiratory problems or sleep  disordered breathing.   MEDICATIONS:  Prenatal vitamins.   DRUG INTOLERANCE:  PERCOCET.   REVIEW OF SYSTEMS:  Some daytime sleepiness if inactive. Nasal  congestion at night, shifts from one side to the other. Occasional  frontal headache. Difficulty maintaining sleep and some sense of  shortness of breath at night without wheeze or cough. She is not having  significant reflux, chest pain, palpitations or leg pain.   PAST HISTORY:  1. Migraines.  2. Breast reduction in 1992.  3. No history of thyroid disease.  4. She had a history of a cyst at her neck which was worked up but no      specific concern.  5. She has had no ENT surgery.  6. No history of asthma or other lung disease.   SOCIAL HISTORY:  Quit smoking in May of 2007. No alcohol since July of  2007. She is married.   FAMILY HISTORY:  Maternal grandmother  with emphysema and arthritis.  Paternal grandmother with cancer. Nobody known to have sleep disordered  breathing.   OBJECTIVE:  VITAL SIGNS: Weight 220 pounds. Blood pressure 122/70, pulse  93, room air saturation 97%.  GENERAL APPEARANCE: She seems alert and comfortable during  conversational speech in the exam room.  SKIN: No rash.  ADENOPATHY: None at the neck, shoulders, or axillae.  HEENT: Nasal airways unobstructed. Palates facing is 2 to 3/4 with  normal voice, no stridor. No neck vein distention.  CHEST: Quiet, clear, unlabored breathing.  CARDIAC: Heart sounds regular without murmur or gallop.  EXTREMITIES: No clubbing, cyanosis or significant edema.   IMPRESSION:  Episodic nocturnal dyspnea of uncertain significance in  late pregnancy. The most obvious thought is that unless she is actively  desaturating, she is likely to resolve any mild sleep apnea or hypoxia  problems with delivery. We mainly need to protect her from oxygen  desaturation until she has her baby.  We can  reevaluate after that if  there is residual concern. Right now I do not think we need to treat her  nasal congestion but we will work on that if it bothers her more, as  discussed with her.   PLAN:  We are scheduling an overnight oximetry with office return in a  month but earlier p.r.n.   I appreciate the chance to see her and would be happy to discuss her  care.     Clinton D. Maple Hudson, MD, Tonny Bollman, FACP  Electronically Signed    CDY/MedQ  DD: 07/25/2006  DT: 07/25/2006  Job #: [id number]   cc:   Titus Dubin. Alwyn Ren, MD,FACP,FCCP  Chester Holstein. Earlene Plater, M.D.

## 2011-02-11 LAB — DIFFERENTIAL
Basophils Relative: 1
Eosinophils Absolute: 0.2
Eosinophils Relative: 3
Monocytes Absolute: 0.4
Monocytes Relative: 7
Neutrophils Relative %: 64

## 2011-02-11 LAB — URINALYSIS, ROUTINE W REFLEX MICROSCOPIC
Bilirubin Urine: NEGATIVE
Glucose, UA: NEGATIVE
Hgb urine dipstick: NEGATIVE
Specific Gravity, Urine: 1.01
pH: 7

## 2011-02-11 LAB — BASIC METABOLIC PANEL
CO2: 19
Chloride: 106
Creatinine, Ser: 0.84
GFR calc Af Amer: >60

## 2011-02-11 LAB — CBC
HCT: 38.5
Hemoglobin: 13.2
MCHC: 34.3
MCV: 90.3
RBC: 4.27

## 2011-03-10 ENCOUNTER — Other Ambulatory Visit (HOSPITAL_COMMUNITY): Payer: Self-pay | Admitting: Endocrinology

## 2011-03-10 DIAGNOSIS — C73 Malignant neoplasm of thyroid gland: Secondary | ICD-10-CM

## 2011-03-31 ENCOUNTER — Encounter (HOSPITAL_COMMUNITY)
Admission: RE | Admit: 2011-03-31 | Discharge: 2011-03-31 | Disposition: A | Discharge status: Home / Self Care | Payer: BC Managed Care – PPO | Source: Ambulatory Visit | Attending: Endocrinology | Admitting: Endocrinology

## 2011-03-31 DIAGNOSIS — C73 Malignant neoplasm of thyroid gland: Secondary | ICD-10-CM | POA: Insufficient documentation

## 2011-03-31 MED ORDER — THYROTROPIN ALFA 1.1 MG IM SOLR
0.9000 mg | INTRAMUSCULAR | Status: DC
  Filled 2011-03-31: qty 0.9

## 2011-04-01 ENCOUNTER — Encounter (HOSPITAL_COMMUNITY)
Admission: RE | Admit: 2011-04-01 | Discharge: 2011-04-01 | Disposition: A | Discharge status: Home / Self Care | Payer: BC Managed Care – PPO | Source: Ambulatory Visit | Attending: Endocrinology | Admitting: Endocrinology

## 2011-04-01 MED ORDER — THYROTROPIN ALFA 1.1 MG IM SOLR
0.9000 mg | INTRAMUSCULAR | Status: DC
  Filled 2011-04-01: qty 0.9

## 2011-04-02 ENCOUNTER — Encounter (HOSPITAL_COMMUNITY)
Admission: RE | Admit: 2011-04-02 | Discharge: 2011-04-02 | Disposition: A | Discharge status: Home / Self Care | Payer: BC Managed Care – PPO | Source: Ambulatory Visit | Attending: Endocrinology | Admitting: Endocrinology

## 2011-04-04 ENCOUNTER — Encounter (HOSPITAL_COMMUNITY)
Admission: RE | Admit: 2011-04-04 | Discharge: 2011-04-04 | Disposition: A | Discharge status: Home / Self Care | Payer: BC Managed Care – PPO | Source: Ambulatory Visit | Attending: Endocrinology | Admitting: Endocrinology

## 2011-04-04 MED ORDER — SODIUM IODIDE I 131 CAPSULE
4.0000 | Freq: Once | INTRAVENOUS | Status: AC | PRN
  Administered 2011-04-04: 4 via ORAL

## 2011-04-09 ENCOUNTER — Other Ambulatory Visit (HOSPITAL_COMMUNITY): Payer: Self-pay | Admitting: Endocrinology

## 2011-04-09 DIAGNOSIS — C73 Malignant neoplasm of thyroid gland: Secondary | ICD-10-CM

## 2011-08-05 ENCOUNTER — Encounter: Payer: Self-pay | Admitting: Nurse Practitioner

## 2011-08-05 ENCOUNTER — Ambulatory Visit (INDEPENDENT_AMBULATORY_CARE_PROVIDER_SITE_OTHER): Payer: BC Managed Care – PPO | Admitting: Nurse Practitioner

## 2011-08-05 DIAGNOSIS — G43909 Migraine, unspecified, not intractable, without status migrainosus: Secondary | ICD-10-CM

## 2011-08-05 MED ORDER — TOPIRAMATE 100 MG PO TABS: 100.0000 mg | ORAL_TABLET | Freq: Two times a day (BID) | ORAL | Status: DC

## 2011-08-05 MED ORDER — NAPROXEN SODIUM 550 MG PO TABS: 550.0000 mg | ORAL_TABLET | Freq: Two times a day (BID) | ORAL | Status: AC

## 2011-08-05 MED ORDER — PREDNISONE 20 MG PO TABS: 20.0000 mg | ORAL_TABLET | Freq: Every day | ORAL | Status: AC

## 2011-08-05 MED ORDER — RIZATRIPTAN BENZOATE 10 MG PO TABS: 10.0000 mg | ORAL_TABLET | Freq: Once | ORAL | Status: DC | PRN

## 2011-08-05 NOTE — Patient Instructions (Signed)

## 2011-08-05 NOTE — Progress Notes (Signed)
Patient is here for headache follow up.  Wants to discuss Topomax and does need refills on some medications.

## 2011-08-05 NOTE — Progress Notes (Signed)
S: Pt is in office today for yearly visit. She is currently having headache 2 days per month that last several days. She is in a study at [facility].  Since our last visit she has stopped seeing her primary care MD Dr Alwyn Ren and started seeing Dr Angelena Sole. He has taken her off her birth control and put her on prometrium after drawing a hormone panel. He has also done food allergy and given her a list of foods to avoid. She states she feels better, but is having more migraines in menses week. She is using condoms and foam for contraception and is considering Esure procedure.   O: Alert, oriented, NAD Neuro : Negative Skin: warm and dry  A: Migraine headache  P: Will refill Topamax, Anaprox, Vicoden and prednisone. She will switch from Treximet to maxalt as it has gone generic. She is aware of contradictions in becoming pregnant with Topamax.  RTC 1 year

## 2011-08-19 ENCOUNTER — Telehealth: Payer: Self-pay | Admitting: Nurse Practitioner

## 2011-08-19 NOTE — Telephone Encounter (Signed)
Sheila Curtis called stating her Vicodin prescription from last week did not go thru to the pharmacy.  She uses [facility].  Her other prescriptions were properly routed to the pharmacy.

## 2011-08-26 ENCOUNTER — Other Ambulatory Visit: Payer: Self-pay | Admitting: Obstetrics and Gynecology

## 2011-08-26 DIAGNOSIS — Z1231 Encounter for screening mammogram for malignant neoplasm of breast: Secondary | ICD-10-CM

## 2011-08-27 ENCOUNTER — Other Ambulatory Visit: Payer: Self-pay | Admitting: Gynecology

## 2011-09-04 ENCOUNTER — Ambulatory Visit
Admission: RE | Admit: 2011-09-04 | Discharge: 2011-09-04 | Disposition: A | Discharge status: Home / Self Care | Payer: BC Managed Care – PPO | Source: Ambulatory Visit | Attending: Obstetrics and Gynecology | Admitting: Obstetrics and Gynecology

## 2011-09-04 DIAGNOSIS — Z1231 Encounter for screening mammogram for malignant neoplasm of breast: Secondary | ICD-10-CM

## 2012-02-10 ENCOUNTER — Telehealth: Payer: Self-pay | Admitting: *Deleted

## 2012-02-10 DIAGNOSIS — G43909 Migraine, unspecified, not intractable, without status migrainosus: Secondary | ICD-10-CM

## 2012-02-10 MED ORDER — PREDNISONE 20 MG PO TABS: ORAL_TABLET | ORAL | Status: DC

## 2012-02-10 NOTE — Telephone Encounter (Signed)
Patient has been having a migraine for over a week now and would like a steroid pack to help it go away.

## 2012-03-26 MED FILL — Thyrotropin Alfa For Inj 1.1 MG: INTRAMUSCULAR | Qty: 0.9 | Status: AC

## 2012-04-20 ENCOUNTER — Other Ambulatory Visit: Payer: Self-pay | Admitting: Endocrinology

## 2012-04-20 DIAGNOSIS — C73 Malignant neoplasm of thyroid gland: Secondary | ICD-10-CM

## 2012-06-07 ENCOUNTER — Encounter (HOSPITAL_COMMUNITY)
Admission: RE | Admit: 2012-06-07 | Discharge: 2012-06-07 | Disposition: A | Discharge status: Home / Self Care | Payer: BC Managed Care – PPO | Source: Ambulatory Visit | Attending: Endocrinology | Admitting: Endocrinology

## 2012-06-07 DIAGNOSIS — C73 Malignant neoplasm of thyroid gland: Secondary | ICD-10-CM | POA: Insufficient documentation

## 2012-06-07 MED ORDER — THYROTROPIN ALFA 1.1 MG IM SOLR
0.9000 mg | INTRAMUSCULAR | Status: AC
  Administered 2012-06-07: 0.9 mg via INTRAMUSCULAR
  Filled 2012-06-07: qty 0.9

## 2012-06-08 ENCOUNTER — Encounter (HOSPITAL_COMMUNITY)
Admission: RE | Admit: 2012-06-08 | Discharge: 2012-06-08 | Disposition: A | Discharge status: Home / Self Care | Payer: BC Managed Care – PPO | Source: Ambulatory Visit | Attending: Endocrinology | Admitting: Endocrinology

## 2012-06-09 ENCOUNTER — Encounter (HOSPITAL_COMMUNITY)
Admission: RE | Admit: 2012-06-09 | Discharge: 2012-06-09 | Disposition: A | Discharge status: Home / Self Care | Payer: BC Managed Care – PPO | Source: Ambulatory Visit | Attending: Endocrinology | Admitting: Endocrinology

## 2012-06-09 LAB — HCG, SERUM, QUALITATIVE: Preg, Serum: NEGATIVE

## 2012-06-09 MED ORDER — SODIUM IODIDE I 131 CAPSULE
4.0000 | Freq: Once | INTRAVENOUS | Status: AC | PRN
  Administered 2012-06-09: 4 via ORAL

## 2012-06-11 ENCOUNTER — Encounter (HOSPITAL_COMMUNITY)
Admission: RE | Admit: 2012-06-11 | Discharge: 2012-06-11 | Disposition: A | Discharge status: Home / Self Care | Payer: BC Managed Care – PPO | Source: Ambulatory Visit | Attending: Endocrinology | Admitting: Endocrinology

## 2012-06-11 MED FILL — Thyrotropin Alfa For Inj 1.1 MG: INTRAMUSCULAR | Qty: 0.9 | Status: AC

## 2012-07-27 ENCOUNTER — Telehealth: Payer: Self-pay | Admitting: Nurse Practitioner

## 2012-07-27 DIAGNOSIS — G43909 Migraine, unspecified, not intractable, without status migrainosus: Secondary | ICD-10-CM

## 2012-07-27 NOTE — Telephone Encounter (Signed)
Unable to send prescriptions via computer, will call in to pharmacy [facility] [phone]  Treximet # 9/ 11 refills  Prednisone 20mg  as directed # 10 tabs / nr  Vicoden ES 1 q4 hours prn migraine # 30/ nr

## 2012-08-16 ENCOUNTER — Other Ambulatory Visit: Payer: Self-pay

## 2012-08-16 DIAGNOSIS — Z1231 Encounter for screening mammogram for malignant neoplasm of breast: Secondary | ICD-10-CM

## 2012-08-17 ENCOUNTER — Encounter: Payer: Self-pay | Admitting: Nurse Practitioner

## 2012-08-17 ENCOUNTER — Ambulatory Visit (INDEPENDENT_AMBULATORY_CARE_PROVIDER_SITE_OTHER): Payer: BC Managed Care – PPO | Admitting: Nurse Practitioner

## 2012-08-17 VITALS — BP 102/72 | HR 67 | Resp 16 | Ht 68.0 in | Wt 169.0 lb

## 2012-08-17 DIAGNOSIS — G43909 Migraine, unspecified, not intractable, without status migrainosus: Secondary | ICD-10-CM | POA: Insufficient documentation

## 2012-08-17 MED ORDER — PREDNISONE 20 MG PO TABS: ORAL_TABLET | ORAL | Status: DC

## 2012-08-17 MED ORDER — NAPROXEN SODIUM 550 MG PO TABS: 550.0000 mg | ORAL_TABLET | Freq: Two times a day (BID) | ORAL | Status: DC

## 2012-08-17 MED ORDER — RIZATRIPTAN BENZOATE 10 MG PO TABS: 10.0000 mg | ORAL_TABLET | ORAL | Status: DC | PRN

## 2012-08-17 MED ORDER — HYDROCODONE-ACETAMINOPHEN 7.5-650 MG PO TABS: 1.0000 | ORAL_TABLET | Freq: Four times a day (QID) | ORAL | Status: DC | PRN

## 2012-08-17 MED ORDER — TOPIRAMATE 100 MG PO TABS: 100.0000 mg | ORAL_TABLET | Freq: Two times a day (BID) | ORAL | Status: DC

## 2012-08-17 MED ORDER — VENLAFAXINE HCL ER 37.5 MG PO CP24: 37.5000 mg | ORAL_CAPSULE | Freq: Three times a day (TID) | ORAL | Status: DC

## 2012-08-17 NOTE — Patient Instructions (Signed)
Migraine Headache A migraine headache is an intense, throbbing pain on one or both sides of your head. A migraine can last for 30 minutes to several hours. CAUSES  The exact cause of a migraine headache is not always known. However, a migraine may be caused when nerves in the brain become irritated and release chemicals that cause inflammation. This causes pain. SYMPTOMS  Pain on one or both sides of your head.  Pulsating or throbbing pain.  Severe pain that prevents daily activities.  Pain that is aggravated by any physical activity.  Nausea, vomiting, or both.  Dizziness.  Pain with exposure to bright lights, loud noises, or activity.  General sensitivity to bright lights, loud noises, or smells. Before you get a migraine, you may get warning signs that a migraine is coming (aura). An aura may include:  Seeing flashing lights.  Seeing bright spots, halos, or zig-zag lines.  Having tunnel vision or blurred vision.  Having feelings of numbness or tingling.  Having trouble talking.  Having muscle weakness. MIGRAINE TRIGGERS  Alcohol.  Smoking.  Stress.  Menstruation.  Aged cheeses.  Foods or drinks that contain nitrates, glutamate, aspartame, or tyramine.  Lack of sleep.  Chocolate.  Caffeine.  Hunger.  Physical exertion.  Fatigue.  Medicines used to treat chest pain (nitroglycerine), birth control pills, estrogen, and some blood pressure medicines. DIAGNOSIS  A migraine headache is often diagnosed based on:  Symptoms.  Physical examination.  A CT scan or MRI of your head. TREATMENT Medicines may be given for pain and nausea. Medicines can also be given to help prevent recurrent migraines.  HOME CARE INSTRUCTIONS  Only take over-the-counter or prescription medicines for pain or discomfort as directed by your caregiver. The use of long-term narcotics is not recommended.  Lie down in a dark, quiet room when you have a migraine.  Keep a journal  to find out what may trigger your migraine headaches. For example, write down:  What you eat and drink.  How much sleep you get.  Any change to your diet or medicines.  Limit alcohol consumption.  Quit smoking if you smoke.  Get 7 to 9 hours of sleep, or as recommended by your caregiver.  Limit stress.  Keep lights dim if bright lights bother you and make your migraines worse. SEEK IMMEDIATE MEDICAL CARE IF:   Your migraine becomes severe.  You have a fever.  You have a stiff neck.  You have vision loss.  You have muscular weakness or loss of muscle control.  You start losing your balance or have trouble walking.  You feel faint or pass out.  You have severe symptoms that are different from your first symptoms. MAKE SURE YOU:   Understand these instructions.  Will watch your condition.  Will get help right away if you are not doing well or get worse. Document Released: 05/05/2005 Document Revised: 07/28/2011 Document Reviewed: 04/25/2011 ExitCare Patient Information 2013 [employer].  

## 2012-08-17 NOTE — Progress Notes (Signed)
S: Pt comes to office today to follow up on migraines. She is doing a little worse lately due to some excessive travel with her job. She is currently using condoms for birht control and husband is considering vasectomy.   O: General NAD Cardia RRR Lungs lear Neuro: negative  A: Migraine  P: Will add Effexor 37.5 mg TID and she is instructed in how to manipulate the dose. She will stay on Topamax and use acute meds as needed. RTC one year

## 2012-09-10 ENCOUNTER — Other Ambulatory Visit: Payer: Self-pay | Admitting: Obstetrics and Gynecology

## 2012-09-10 ENCOUNTER — Ambulatory Visit
Admission: RE | Admit: 2012-09-10 | Discharge: 2012-09-10 | Disposition: A | Discharge status: Home / Self Care | Payer: BC Managed Care – PPO | Source: Ambulatory Visit

## 2012-09-10 DIAGNOSIS — N6459 Other signs and symptoms in breast: Secondary | ICD-10-CM

## 2012-09-10 DIAGNOSIS — Z1231 Encounter for screening mammogram for malignant neoplasm of breast: Secondary | ICD-10-CM

## 2012-09-24 ENCOUNTER — Ambulatory Visit
Admission: RE | Admit: 2012-09-24 | Discharge: 2012-09-24 | Disposition: A | Discharge status: Home / Self Care | Payer: BC Managed Care – PPO | Source: Ambulatory Visit | Attending: Obstetrics and Gynecology | Admitting: Obstetrics and Gynecology

## 2012-09-24 DIAGNOSIS — N6459 Other signs and symptoms in breast: Secondary | ICD-10-CM

## 2013-07-26 ENCOUNTER — Encounter: Payer: BC Managed Care – PPO | Admitting: Nurse Practitioner

## 2013-08-10 ENCOUNTER — Other Ambulatory Visit: Payer: Self-pay | Admitting: *Deleted

## 2013-08-10 DIAGNOSIS — G43909 Migraine, unspecified, not intractable, without status migrainosus: Secondary | ICD-10-CM

## 2013-08-10 MED ORDER — SUMATRIPTAN-NAPROXEN SODIUM 85-500 MG PO TABS: 1.0000 | ORAL_TABLET | ORAL | Status: DC | PRN

## 2013-08-10 NOTE — Telephone Encounter (Signed)
Pharmacy faxed request for refill for treximet.  [place] for one year per Monna Fam, [place].

## 2013-08-16 ENCOUNTER — Encounter: Payer: Self-pay | Admitting: Nurse Practitioner

## 2013-08-16 ENCOUNTER — Ambulatory Visit (INDEPENDENT_AMBULATORY_CARE_PROVIDER_SITE_OTHER): Payer: BC Managed Care – PPO | Admitting: Nurse Practitioner

## 2013-08-16 VITALS — BP 117/74 | HR 81 | Ht 68.0 in | Wt 183.0 lb

## 2013-08-16 DIAGNOSIS — G43909 Migraine, unspecified, not intractable, without status migrainosus: Secondary | ICD-10-CM

## 2013-08-16 MED ORDER — HYDROCODONE-ACETAMINOPHEN 5-325 MG PO TABS: 2.0000 | ORAL_TABLET | ORAL | Status: DC | PRN

## 2013-08-16 MED ORDER — SUMATRIPTAN-NAPROXEN SODIUM 85-500 MG PO TABS: 1.0000 | ORAL_TABLET | ORAL | Status: DC | PRN

## 2013-08-16 MED ORDER — RIZATRIPTAN BENZOATE 10 MG PO TABS: 10.0000 mg | ORAL_TABLET | ORAL | Status: DC | PRN

## 2013-08-16 MED ORDER — TOPIRAMATE 200 MG PO TABS
200.0000 mg | ORAL_TABLET | Freq: Every day | ORAL | Status: DC
Start: 2013-08-16 — End: 2014-07-18

## 2013-08-16 NOTE — Patient Instructions (Signed)
Migraine Headache A migraine headache is an intense, throbbing pain on one or both sides of your head. A migraine can last for 30 minutes to several hours. CAUSES  The exact cause of a migraine headache is not always known. However, a migraine may be caused when nerves in the brain become irritated and release chemicals that cause inflammation. This causes pain. Certain things may also trigger migraines, such as:  Alcohol.  Smoking.  Stress.  Menstruation.  Aged cheeses.  Foods or drinks that contain nitrates, glutamate, aspartame, or tyramine.  Lack of sleep.  Chocolate.  Caffeine.  Hunger.  Physical exertion.  Fatigue.  Medicines used to treat chest pain (nitroglycerine), birth control pills, estrogen, and some blood pressure medicines. SIGNS AND SYMPTOMS  Pain on one or both sides of your head.  Pulsating or throbbing pain.  Severe pain that prevents daily activities.  Pain that is aggravated by any physical activity.  Nausea, vomiting, or both.  Dizziness.  Pain with exposure to bright lights, loud noises, or activity.  General sensitivity to bright lights, loud noises, or smells. Before you get a migraine, you may get warning signs that a migraine is coming (aura). An aura may include:  Seeing flashing lights.  Seeing bright spots, halos, or zig-zag lines.  Having tunnel vision or blurred vision.  Having feelings of numbness or tingling.  Having trouble talking.  Having muscle weakness. DIAGNOSIS  A migraine headache is often diagnosed based on:  Symptoms.  Physical exam.  A CT scan or MRI of your head. These imaging tests cannot diagnose migraines, but they can help rule out other causes of headaches. TREATMENT Medicines may be given for pain and nausea. Medicines can also be given to help prevent recurrent migraines.  HOME CARE INSTRUCTIONS  Only take over-the-counter or prescription medicines for pain or discomfort as directed by your  health care provider. The use of long-term narcotics is not recommended.  Lie down in a dark, quiet room when you have a migraine.  Keep a journal to find out what may trigger your migraine headaches. For example, write down:  What you eat and drink.  How much sleep you get.  Any change to your diet or medicines.  Limit alcohol consumption.  Quit smoking if you smoke.  Get 7 9 hours of sleep, or as recommended by your health care provider.  Limit stress.  Keep lights dim if bright lights bother you and make your migraines worse. SEEK IMMEDIATE MEDICAL CARE IF:   Your migraine becomes severe.  You have a fever.  You have a stiff neck.  You have vision loss.  You have muscular weakness or loss of muscle control.  You start losing your balance or have trouble walking.  You feel faint or pass out.  You have severe symptoms that are different from your first symptoms. MAKE SURE YOU:   Understand these instructions.  Will watch your condition.  Will get help right away if you are not doing well or get worse. Document Released: 05/05/2005 Document Revised: 02/23/2013 Document Reviewed: 01/10/2013 ExitCare Patient Information 2014 [employer].  

## 2013-08-16 NOTE — Progress Notes (Signed)
History:  Sheila Curtis is a 43 y.o. No obstetric history on file. who presents to clinic today for yearly follow up on migraines. She was diagnosed with ADD this year and but on Adderall. She feels her migraines are better in they are now only lasting 2 days verses 3 days. She needs her medications refilled. She discontinued the Effexor and is ok with that decision. Her husband did not get vasectomy and she is now considering BTL. She sees Dr Edwyna Ready for GYN issues. She is aware that she should not become pregnant on her current medications.    The following portions of the patient's history were reviewed and updated as appropriate: allergies, current medications, past family history, past medical history, past social history, past surgical history and problem list.  Review of Systems:  Pertinent items are noted in HPI.  Objective:  Physical Exam BP 117/74  Pulse 81  Ht 5\' 8"  (1.727 m)  Wt 183 lb (83.008 kg)  BMI 27.83 kg/m2  LMP 08/09/2013 GENERAL: Well-developed, well-nourished female in no acute distress.  HEENT: Normocephalic, atraumatic.  NECK: Supple. Normal thyroid.  LUNGS: Normal rate. Clear to auscultation bilaterally.  HEART: Regular rate and rhythm with no adventitious sounds.  EXTREMITIES: No cyanosis, clubbing, or edema, 2+ distal pulses.   Labs and Imaging No results found.  Assessment & Plan:  Assessment:  Migraine without Aura  Plans:  Refill medications: Topamax, Treximet, Vicoden, Maxalt, Prednisone( samples from office) Advised to ensure proper birth control with these medications RTC one year  Olegario Messier, NP 08/16/2013 9:46 AM

## 2013-09-02 ENCOUNTER — Other Ambulatory Visit: Payer: Self-pay

## 2013-09-02 DIAGNOSIS — Z1231 Encounter for screening mammogram for malignant neoplasm of breast: Secondary | ICD-10-CM

## 2013-09-30 ENCOUNTER — Encounter (INDEPENDENT_AMBULATORY_CARE_PROVIDER_SITE_OTHER): Payer: Self-pay

## 2013-09-30 ENCOUNTER — Ambulatory Visit
Admission: RE | Admit: 2013-09-30 | Discharge: 2013-09-30 | Disposition: A | Discharge status: Home / Self Care | Payer: BC Managed Care – PPO | Source: Ambulatory Visit

## 2013-09-30 DIAGNOSIS — Z1231 Encounter for screening mammogram for malignant neoplasm of breast: Secondary | ICD-10-CM

## 2014-04-12 ENCOUNTER — Other Ambulatory Visit: Payer: Self-pay | Admitting: Obstetrics and Gynecology

## 2014-04-12 DIAGNOSIS — N6489 Other specified disorders of breast: Secondary | ICD-10-CM

## 2014-04-12 DIAGNOSIS — N6002 Solitary cyst of left breast: Secondary | ICD-10-CM

## 2014-04-28 ENCOUNTER — Ambulatory Visit
Admission: RE | Admit: 2014-04-28 | Discharge: 2014-04-28 | Disposition: A | Discharge status: Home / Self Care | Payer: BC Managed Care – PPO | Source: Ambulatory Visit | Attending: Obstetrics and Gynecology | Admitting: Obstetrics and Gynecology

## 2014-04-28 DIAGNOSIS — N6002 Solitary cyst of left breast: Secondary | ICD-10-CM

## 2014-04-28 DIAGNOSIS — N6489 Other specified disorders of breast: Secondary | ICD-10-CM

## 2014-07-10 ENCOUNTER — Telehealth: Payer: Self-pay | Admitting: *Deleted

## 2014-07-10 DIAGNOSIS — G43911 Migraine, unspecified, intractable, with status migrainosus: Secondary | ICD-10-CM

## 2014-07-10 MED ORDER — SUMATRIPTAN-NAPROXEN SODIUM 85-500 MG PO TABS
1.0000 | ORAL_TABLET | ORAL | Status: DC | PRN
Start: 2014-07-10 — End: 2014-07-18

## 2014-07-10 NOTE — Telephone Encounter (Signed)
I have sent over refills for patient to her pharmacy.  No staff in our office have seen a prior authorization come through the fax.  If a prior authorization is needed, these refills I sent to the pharmacy should generate a prior authorization and the pharmacy should fax one to [place].

## 2014-07-10 NOTE — Telephone Encounter (Signed)
-----   Message from Tarry Kos sent at 07/10/2014  3:47 PM EST ----- Contact: [phone] Patient states her pharmacy faxed over prior auth for her Treximet and "someone in the office verified the fax was received",  Rx has still not been filled and patient has now had headache for one week.  She is very anxious to get this refilled

## 2014-07-11 ENCOUNTER — Telehealth: Payer: Self-pay | Admitting: *Deleted

## 2014-07-11 NOTE — Telephone Encounter (Signed)
Attempted to call [employer] all the numbers provided on the patients card on file are connecting me with "win a [employer] gift card"  Or chance to "win a trip to the [place]".  Will call patient tomorrow to see if she has a new card with new numbers to contact patients insurance company to obtain prior authorization.

## 2014-07-18 ENCOUNTER — Ambulatory Visit (INDEPENDENT_AMBULATORY_CARE_PROVIDER_SITE_OTHER): Payer: BLUE CROSS/BLUE SHIELD | Admitting: Nurse Practitioner

## 2014-07-18 ENCOUNTER — Encounter: Payer: Self-pay | Admitting: Nurse Practitioner

## 2014-07-18 VITALS — BP 126/85 | HR 99 | Resp 16 | Ht 68.0 in | Wt 173.0 lb

## 2014-07-18 DIAGNOSIS — G43911 Migraine, unspecified, intractable, with status migrainosus: Secondary | ICD-10-CM | POA: Diagnosis not present

## 2014-07-18 DIAGNOSIS — G43009 Migraine without aura, not intractable, without status migrainosus: Secondary | ICD-10-CM | POA: Diagnosis not present

## 2014-07-18 MED ORDER — TOPIRAMATE 200 MG PO TABS: 200.0000 mg | ORAL_TABLET | Freq: Every day | ORAL | Status: DC

## 2014-07-18 MED ORDER — SUMATRIPTAN-NAPROXEN SODIUM 85-500 MG PO TABS: 1.0000 | ORAL_TABLET | ORAL | Status: DC | PRN

## 2014-07-18 MED ORDER — NAPROXEN SODIUM 550 MG PO TABS: 550.0000 mg | ORAL_TABLET | Freq: Two times a day (BID) | ORAL | Status: DC

## 2014-07-18 MED ORDER — HYDROCODONE-ACETAMINOPHEN 5-325 MG PO TABS: 2.0000 | ORAL_TABLET | ORAL | Status: DC | PRN

## 2014-07-18 MED ORDER — RIZATRIPTAN BENZOATE 10 MG PO TABS: 10.0000 mg | ORAL_TABLET | ORAL | Status: DC | PRN

## 2014-07-18 MED ORDER — ONDANSETRON 4 MG PO TBDP: 4.0000 mg | ORAL_TABLET | Freq: Three times a day (TID) | ORAL | Status: AC | PRN

## 2014-07-18 MED ORDER — PROMETHAZINE HCL 25 MG PO TABS: 25.0000 mg | ORAL_TABLET | Freq: Four times a day (QID) | ORAL | Status: AC | PRN

## 2014-07-18 NOTE — Patient Instructions (Signed)

## 2014-07-18 NOTE — Progress Notes (Signed)
History:  Sheila Curtis is a 44 y.o. No obstetric history on file. who presents to [facility] clinic today for yearly follow up on her migraines. She is overall doing well with her migraines when she is on her medications. She fell down stairs and received a hairline fracture to her left foot. This is causing her not to be able to exercise. Her husband did get a vasectomy this year and that has made her very happy. 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  The following portions of the patient's history were reviewed and updated as appropriate: allergies, current medications, past family history, past medical history, past social history, past surgical history and problem list.  Review of Systems:  A comprehensive review of systems was negative.  Objective:  Physical Exam Ht 5\' 8"  (1.727 m)  Wt 173 lb (78.472 kg)  BMI 26.31 kg/m2  LMP 07/11/2014 GENERAL: Well-developed, well-nourished female in no acute distress.  HEENT: Normocephalic, atraumatic.  NECK: Supple. Normal thyroid.  LUNGS: Normal rate. Clear to auscultation bilaterally.  HEART: Regular rate and rhythm with no adventitious sounds.  EXTREMITIES: No cyanosis, clubbing, or edema, 2+ distal pulses.   Labs and Imaging No results found.  Assessment & Plan:  Assessment:  Migraine without aura  Plans:  Refill medications RTC one year or sooner as needed  Olegario Messier, NP 07/18/2014 2:10 PM

## 2014-10-11 ENCOUNTER — Other Ambulatory Visit: Payer: Self-pay

## 2014-10-11 DIAGNOSIS — Z1231 Encounter for screening mammogram for malignant neoplasm of breast: Secondary | ICD-10-CM

## 2014-11-07 ENCOUNTER — Ambulatory Visit
Admission: RE | Admit: 2014-11-07 | Discharge: 2014-11-07 | Disposition: A | Discharge status: Home / Self Care | Payer: BLUE CROSS/BLUE SHIELD | Source: Ambulatory Visit

## 2014-11-07 DIAGNOSIS — Z1231 Encounter for screening mammogram for malignant neoplasm of breast: Secondary | ICD-10-CM

## 2015-04-05 ENCOUNTER — Other Ambulatory Visit (HOSPITAL_COMMUNITY): Payer: Self-pay | Admitting: Endocrinology

## 2015-04-05 DIAGNOSIS — C73 Malignant neoplasm of thyroid gland: Secondary | ICD-10-CM

## 2015-04-30 ENCOUNTER — Encounter (HOSPITAL_COMMUNITY)
Admission: RE | Admit: 2015-04-30 | Discharge: 2015-04-30 | Disposition: A | Discharge status: Home / Self Care | Payer: BLUE CROSS/BLUE SHIELD | Source: Ambulatory Visit | Attending: Endocrinology | Admitting: Endocrinology

## 2015-04-30 DIAGNOSIS — C73 Malignant neoplasm of thyroid gland: Secondary | ICD-10-CM | POA: Diagnosis not present

## 2015-04-30 MED ORDER — STERILE WATER FOR INJECTION IJ SOLN
INTRAMUSCULAR | Status: AC
Start: 2015-04-30 — End: 2015-04-30
  Filled 2015-04-30: qty 10

## 2015-04-30 MED ORDER — THYROTROPIN ALFA 1.1 MG IM SOLR
0.9000 mg | INTRAMUSCULAR | Status: AC
  Administered 2015-04-30: 0.9 mg via INTRAMUSCULAR

## 2015-05-01 ENCOUNTER — Encounter (HOSPITAL_COMMUNITY)
Admission: RE | Admit: 2015-05-01 | Discharge: 2015-05-01 | Disposition: A | Discharge status: Home / Self Care | Payer: BLUE CROSS/BLUE SHIELD | Source: Ambulatory Visit | Attending: Endocrinology | Admitting: Endocrinology

## 2015-05-01 DIAGNOSIS — C73 Malignant neoplasm of thyroid gland: Secondary | ICD-10-CM | POA: Insufficient documentation

## 2015-05-02 ENCOUNTER — Encounter (HOSPITAL_COMMUNITY)
Admission: RE | Admit: 2015-05-02 | Discharge: 2015-05-02 | Disposition: A | Discharge status: Home / Self Care | Payer: BLUE CROSS/BLUE SHIELD | Source: Ambulatory Visit | Attending: Endocrinology | Admitting: Endocrinology

## 2015-05-02 DIAGNOSIS — C73 Malignant neoplasm of thyroid gland: Secondary | ICD-10-CM | POA: Insufficient documentation

## 2015-05-02 LAB — HCG, SERUM, QUALITATIVE: Preg, Serum: NEGATIVE

## 2015-05-03 DIAGNOSIS — C73 Malignant neoplasm of thyroid gland: Secondary | ICD-10-CM | POA: Diagnosis not present

## 2015-05-03 MED ORDER — THYROTROPIN ALFA 1.1 MG IM SOLR
0.9000 mg | INTRAMUSCULAR | Status: AC
  Administered 2015-05-01: 0.9 mg via INTRAMUSCULAR

## 2015-05-04 ENCOUNTER — Encounter (HOSPITAL_COMMUNITY)
Admission: RE | Admit: 2015-05-04 | Discharge: 2015-05-04 | Disposition: A | Discharge status: Home / Self Care | Payer: BLUE CROSS/BLUE SHIELD | Source: Ambulatory Visit | Attending: Endocrinology | Admitting: Endocrinology

## 2015-05-04 DIAGNOSIS — C73 Malignant neoplasm of thyroid gland: Secondary | ICD-10-CM | POA: Diagnosis not present

## 2015-05-04 MED ORDER — SODIUM IODIDE I 131 CAPSULE
4.0000 | Freq: Once | INTRAVENOUS | Status: AC | PRN
Start: 2015-05-02 — End: 2015-05-03
  Administered 2015-05-03: 4 via ORAL

## 2015-06-08 ENCOUNTER — Ambulatory Visit (INDEPENDENT_AMBULATORY_CARE_PROVIDER_SITE_OTHER): Payer: BLUE CROSS/BLUE SHIELD | Admitting: Internal Medicine

## 2015-06-08 ENCOUNTER — Encounter: Payer: Self-pay | Admitting: Internal Medicine

## 2015-06-08 VITALS — BP 114/62 | HR 92 | Temp 98.2°F | Resp 12 | Ht 68.0 in | Wt 181.4 lb

## 2015-06-08 DIAGNOSIS — C73 Malignant neoplasm of thyroid gland: Secondary | ICD-10-CM

## 2015-06-08 DIAGNOSIS — E890 Postprocedural hypothyroidism: Secondary | ICD-10-CM

## 2015-06-08 LAB — TSH: TSH: 0.15 u[IU]/mL — AB (ref 0.35–4.50)

## 2015-06-08 NOTE — Patient Instructions (Signed)
Please stop at the lab.  Please continue Armour 120 mg daily in am.  Take the thyroid hormone every day, with water, at least 30 minutes before breakfast, separated by at least 4 hours from: - acid reflux medications - calcium - iron - multivitamins  Please try to join MyChart for easier communication.  Please come back for a follow-up appointment in 4 months.

## 2015-06-08 NOTE — Progress Notes (Signed)
Patient ID: Sheila Curtis, female   DOB: 26-Sep-1970, 45 y.o.   MRN: [id number]   HPI  Sheila Curtis is a 45 y.o.-year-old female, referred by Dr Ronita Hipps for management of papillary thyroid cancer, in remission, and postsurgical hypothyroidism. She also sees Dr. Mylinda Latina with [facility] in [facility].  Pt. has been dx with metastatic Papillary ThyCa in 2009: 08/18/2007: Total thyroidectomy  Pathology: 1. THYROID, RIGHT LOBE, HEMITHYROIDECTOMY: - PAPILLARY THYROID CARCINOMA. (1.8 CM IN GREATEST DIMENSION). - CHRONIC THYROIDITIS. - ONE BENIGN LYMPH NODE (ISTHMUS). - ONE BENIGN PARATHYROID GLAND IDENTIFIED. - INKED MARGINS NEGATIVE FOR TUMOR.  2. LYMPH NODE, CENTRAL COMPARTMENT, RESECTION: - 3 OF 7 LYMPH NODES WITH METASTATIC PAPILLARY THYROID CARCINOMA.  3. THYROID, LEFT, HEMITHYROIDECTOMY: - CHRONIC THYROIDITIS  09/21/2007: RAI treatment with 159 mCi I-131 09/28/2007: Whole body scan negative, except expected uptake in the thyroidectomy bed 02/09/2009: Whole body scan negative 04/04/2011: Whole body scan negative 06/11/2012: Whole body scan negative  She developed posterior L neck pain >> a new whole-body scan was obtained on 05/04/2015 >> negative  Patient brings her records regarding her previous TSH levels, thyroglobulin and ATA, which we reviewed together: 01/2014: Tg 2.1, ATA 6.0 (0-0.9), TSH 0.060 08/18/2014: Tg <2.0 (RIA), ATA 6.7, TSH 0.060 03/29/2015: Tg <2.0 (RIA), ATA 8.9, TSH 0.230 05/04/2015: ATA 11.0, TSH 13.11 (*patient believes that this was not correct)  She has postop hypothyroidism after her total thyroidectomy in 2009; is on Armour 2x 60 mg 200 mcg daily (tried Synthroid and did not help), taken: - fasting - with water - separated by 60 min from b'fast  - no calcium, iron, PPIs, multivitamins   I reviewed pt's thyroid tests - previously suppressed after her thyroidectomy - see above.  Lab Results  Component Value  Date   TSH 3.73 07/02/2007   Pt denies feeling nodules in neck except a small left posterior nodule in the upper part of her back, no  hoarseness, dysphagia/odynophagia, SOB with lying down.  She has + FH of thyroid disorders in: mother. No FH of thyroid cancer.  No h/o radiation tx to head or neck other than RAI tx. No recent use of iodine supplements.  ROS: Constitutional: + Fluctuating weight, no fatigue, no subjective hyperthermia/hypothermia Eyes: no blurry vision, no xerophthalmia ENT: no sore throat, + nodules palpated in left posterior neck, no dysphagia/odynophagia, no hoarseness Cardiovascular: no CP/SOB/palpitations/leg swelling Respiratory: no cough/SOB Gastrointestinal: no N/V/D/C Musculoskeletal: no muscle/joint aches Skin: no rashes, + hair loss Neurological: no tremors/numbness/tingling/dizziness, + migraines since 1990 Psychiatric: no depression/+ anxiety + Low libido   Past Medical History  Diagnosis Date  . Migraine aura, persistent   . Thyroid cancer [facility])    Past Surgical History  Procedure Laterality Date  . Breast reduction surgery  1992  . Cesarean section    . Thyroidectomy  2009   Social History   Social History  . Marital Status: Married    Spouse Name: N/A  . Number of Children: 1   Occupational History  .  self-employed, Optometrist    Social History Main Topics  . Smoking status:  former smoker, quit in 1998   . Smokeless tobacco: Not on file  . Alcohol Use: Yes     Comment: ocasionnaly, vault,, wine   . Drug Use: No  . Sexual Activity: Yes    Birth Control/ Protection: Condom   Current Outpatient Prescriptions on File Prior to Visit  Medication Sig Dispense Refill  . amphetamine-dextroamphetamine (ADDERALL) 30 MG tablet Take  30 mg by mouth daily.    . rizatriptan (MAXALT) 10 MG tablet Take 1 tablet (10 mg total) by mouth as needed for migraine. May repeat in 2 hours if needed 12 tablet 11  . SUMAtriptan-naproxen (TREXIMET) 85-500  MG per tablet Take 1 tablet by mouth every 2 (two) hours as needed for migraine. 9 tablet 11  . topiramate (TOPAMAX) 200 MG tablet Take 1 tablet (200 mg total) by mouth daily. 30 tablet 11  . triamcinolone cream (KENALOG) 0.1 % Apply 1 application topically as needed.   0  . HYDROcodone-acetaminophen (NORCO/VICODIN) 5-325 MG per tablet Take 2 tablets by mouth every 4 (four) hours as needed. (Patient not taking: Reported on 06/08/2015) 40 tablet 0  . naproxen sodium (ANAPROX DS) 550 MG tablet Take 1 tablet (550 mg total) by mouth 2 (two) times daily with a meal. (Patient not taking: Reported on 06/08/2015) 60 tablet 2  . ondansetron (ZOFRAN-ODT) 4 MG disintegrating tablet Take 1 tablet (4 mg total) by mouth every 8 (eight) hours as needed. (Patient not taking: Reported on 06/08/2015) 20 tablet 3  . predniSONE (DELTASONE) 20 MG tablet Take 5 tablets on day one then 4 tablets on day two, 3 tablets on day three, 2 tablets on day two and 1 tablet the last day. (Patient not taking: Reported on 06/08/2015) 15 tablet 1  . promethazine (PHENERGAN) 25 MG tablet Take 1 tablet (25 mg total) by mouth every 6 (six) hours as needed. (Patient not taking: Reported on 06/08/2015) 30 tablet 2   No current facility-administered medications on file prior to visit.   No Known Allergies Family History  Problem Relation Age of Onset  . Hypertension Mother   . Diabetes Maternal Grandmother   . Cancer Maternal Grandmother     esopagus   PE: BP 114/62 mmHg  Pulse 92  Temp(Src) 98.2 F (36.8 C) (Oral)  Resp 12  Ht _0  (1.727 m)  Wt 181 lb 6.4 oz (82.283 kg)  BMI 27.59 kg/m2  SpO2 97% Wt Readings from Last 3 Encounters:  06/08/15 181 lb 6.4 oz (82.283 kg)  07/18/14 173 lb (78.472 kg)  08/16/13 183 lb (83.008 kg)   Constitutional: overweight, in NAD Eyes: PERRLA, EOMI, no exophthalmos ENT: moist mucous membranes, thyroidectomy scar healed, no masses felt in neck, except small round lymph node in the upper edge of  the left trapezius muscle, otherwise, no cervical lymphadenopathy Cardiovascular: RRR, No MRG Respiratory: CTA B Gastrointestinal: abdomen soft, NT, ND, BS+ Musculoskeletal: no deformities, strength intact in all 4 Skin: moist, warm, no rashes Neurological: no tremor with outstretched hands, DTR normal in all 4  ASSESSMENT: 1. Papillary thyroid cancer  2. Hypothyroidism  PLAN:  1. Papillary thyroid cancer, metastatic to lymph nodes - patient signed the release of information consent form >> will try to obtain more records about her thyroid cancer-related tests  - Reviewing the records brought by the patient and available imaging records in Epic, patient had her RAI treatment (159 mCi I-131) in 09/21/2007. She had now 5 whole-body scans there were negative, including the one obtained on 05/04/2015. This is encouraging. However, per records brought by her, her thyroglobulin was 2.1 ng/mL in 01/2014, while in 08/2014 and 03/2015 it has been undetectable (less than 2.0 ng/mL) by the RAI assay from [facility]. This assay was used because she has positive ATA antibodies. She was very alarmed about the positive antibodies which appear to have increased in the last year, however I reassured her that  these are not as worrisome as an increasing thyroglobulin. Ideally, they will start decreasing over time, but we need to give them several years. also, reviewing the pathology of her thyroidectomy, she had chronic thyroiditis, most likely with elevated ATA's. - At today's visit, I would like to recheck her thyroglobulin and ATA and I explained that we may need to obtain a CT of her neck (or an ultrasound) if the thyroglobulin is abnormal. - She has a posterior left round lymph node in the upper margin of her trapezius muscle, which I do not believe is related to a possible thyroid metastasis, but will need to keep an eye on this in the future  2. Patient with postoperative  hypothyroidism, on Armour 120 mg daily  (equivalent of 200 g levothyroxine daily). She appears euthyroid, however, reviewing her previous thyroid tests, they have been suppressed in the past.. She does not appear to have a goiter, thyroid nodules, or neck compression symptoms - We discussed about correct intake of Armour, fasting, with water, separated by at least 30 minutes from breakfast, and separated by more than 4 hours from calcium, iron, multivitamins, acid reflux medications (PPIs).She is taking it correctly. - will check thyroid tests today: TSH, free T4, Free T3 - we discussed about the fact that more than one year after her surgery, we can relax her TSH target to the low normal range - If these are abnormal, she will need to return in 6-8 weeks for repeat labs - If these are normal, I will see her back in 4 months  CC:  Dr Osborne Casco (PCP) Dr Ronita Hipps ([place]) Mylinda Latina ([facility])  - time spent with the patient: 1 hour, of which >50% was spent in obtaining information about her PTC and hypothyroidism, her sxs, reviewing her previous labs, evaluations, and treatments, counseling her about her conditions (please see the discussed topics above), and developing a plan to further investigate it; she had a number of questions which I addressed.   Component     Latest Ref Rng 06/08/2015  Thyroglobulin Ab     <2 IU/mL 11 (H)  Free T4     0.60 - 1.60 ng/dL 0.54 (L)  TSH     0.35 - 4.50 uIU/mL 0.15 (L)  T3, Free     2.3 - 4.2 pg/mL 3.5  Thyroglobulin     2.8 - 40.9 ng/mL <0.1 (L)   TSH still low >> decrease Armour to 90 alternating with 120 mg daily and recheck TFTs in 5-6 weeks. Tg undetectable, but the wrong assay was run >> should have been the [facility] assay since her ATA are positive >> repeat Tg by [facility] assay in 5 weeks.

## 2015-06-11 LAB — THYROGLOBULIN LEVEL

## 2015-06-11 LAB — THYROGLOBULIN ANTIBODY: Thyroglobulin Ab: 11 IU/mL — ABNORMAL HIGH (ref <2)

## 2015-06-11 LAB — T4, FREE: FREE T4: 0.54 ng/dL — AB (ref 0.60–1.60)

## 2015-06-11 LAB — T3, FREE: T3 FREE: 3.5 pg/mL (ref 2.3–4.2)

## 2015-06-15 ENCOUNTER — Encounter: Payer: Self-pay | Admitting: *Deleted

## 2015-07-06 ENCOUNTER — Other Ambulatory Visit (INDEPENDENT_AMBULATORY_CARE_PROVIDER_SITE_OTHER): Payer: BLUE CROSS/BLUE SHIELD

## 2015-07-06 DIAGNOSIS — E890 Postprocedural hypothyroidism: Secondary | ICD-10-CM

## 2015-07-06 DIAGNOSIS — C73 Malignant neoplasm of thyroid gland: Secondary | ICD-10-CM

## 2015-07-06 LAB — T3, FREE: T3 FREE: 2.9 pg/mL (ref 2.3–4.2)

## 2015-07-06 LAB — T4, FREE: FREE T4: 0.65 ng/dL (ref 0.60–1.60)

## 2015-07-06 LAB — TSH: TSH: 0.57 u[IU]/mL (ref 0.35–4.50)

## 2015-07-14 LAB — THYROGLOBULIN LEVEL: Thyroglobulin (TG-RIA): <2 ng/mL

## 2015-09-05 DIAGNOSIS — R946 Abnormal results of thyroid function studies: Secondary | ICD-10-CM | POA: Diagnosis not present

## 2015-09-05 DIAGNOSIS — F900 Attention-deficit hyperactivity disorder, predominantly inattentive type: Secondary | ICD-10-CM | POA: Diagnosis not present

## 2015-09-05 DIAGNOSIS — E559 Vitamin D deficiency, unspecified: Secondary | ICD-10-CM | POA: Diagnosis not present

## 2015-09-05 DIAGNOSIS — E039 Hypothyroidism, unspecified: Secondary | ICD-10-CM | POA: Diagnosis not present

## 2015-09-05 DIAGNOSIS — N943 Premenstrual tension syndrome: Secondary | ICD-10-CM | POA: Diagnosis not present

## 2015-09-05 DIAGNOSIS — R5381 Other malaise: Secondary | ICD-10-CM | POA: Diagnosis not present

## 2015-09-07 DIAGNOSIS — G43009 Migraine without aura, not intractable, without status migrainosus: Secondary | ICD-10-CM | POA: Diagnosis not present

## 2015-09-07 DIAGNOSIS — G44219 Episodic tension-type headache, not intractable: Secondary | ICD-10-CM | POA: Diagnosis not present

## 2015-09-13 DIAGNOSIS — R946 Abnormal results of thyroid function studies: Secondary | ICD-10-CM | POA: Diagnosis not present

## 2015-09-13 DIAGNOSIS — E039 Hypothyroidism, unspecified: Secondary | ICD-10-CM | POA: Diagnosis not present

## 2015-09-13 DIAGNOSIS — R5381 Other malaise: Secondary | ICD-10-CM | POA: Diagnosis not present

## 2015-09-13 DIAGNOSIS — R5383 Other fatigue: Secondary | ICD-10-CM | POA: Diagnosis not present

## 2015-09-20 DIAGNOSIS — R5381 Other malaise: Secondary | ICD-10-CM | POA: Diagnosis not present

## 2015-09-20 DIAGNOSIS — R946 Abnormal results of thyroid function studies: Secondary | ICD-10-CM | POA: Diagnosis not present

## 2015-09-20 DIAGNOSIS — R5383 Other fatigue: Secondary | ICD-10-CM | POA: Diagnosis not present

## 2015-09-20 DIAGNOSIS — B2790 Infectious mononucleosis, unspecified without complication: Secondary | ICD-10-CM | POA: Diagnosis not present

## 2015-10-05 ENCOUNTER — Ambulatory Visit (INDEPENDENT_AMBULATORY_CARE_PROVIDER_SITE_OTHER): Payer: BLUE CROSS/BLUE SHIELD | Admitting: Internal Medicine

## 2015-10-05 ENCOUNTER — Encounter: Payer: Self-pay | Admitting: Internal Medicine

## 2015-10-05 VITALS — BP 104/68 | HR 88 | Temp 98.1°F | Resp 12 | Wt 176.0 lb

## 2015-10-05 DIAGNOSIS — C73 Malignant neoplasm of thyroid gland: Secondary | ICD-10-CM

## 2015-10-05 DIAGNOSIS — E890 Postprocedural hypothyroidism: Secondary | ICD-10-CM

## 2015-10-05 NOTE — Patient Instructions (Signed)
Please continue Armour 120 mg alternating with 90 mg every other day.  Please stop at the lab.  Please come back for a follow-up appointment in 6 months

## 2015-10-05 NOTE — Progress Notes (Signed)
Patient ID: Sheila Curtis, female   DOB: [birth year], 45 y.o.   MRN: [id number]   HPI  Sheila Curtis is a 45 y.o.-year-old female, initially referred by Dr Ronita Hipps for management of papillary thyroid cancer, in remission, and postsurgical hypothyroidism. Last visit 4 mo ago. She also sees Dr. Mylinda Latina with [facility] in [facility].  Pt. has been dx with metastatic Papillary ThyCa in 2009: 08/18/2007: Total thyroidectomy (Dr Harlow Asa) Pathology: 1. THYROID, RIGHT LOBE, HEMITHYROIDECTOMY: - PAPILLARY THYROID CARCINOMA. (1.8 CM IN GREATEST DIMENSION). - CHRONIC THYROIDITIS. - ONE BENIGN LYMPH NODE (ISTHMUS). - ONE BENIGN PARATHYROID GLAND IDENTIFIED. - INKED MARGINS NEGATIVE FOR TUMOR.  2. LYMPH NODE, CENTRAL COMPARTMENT, RESECTION: - 3 OF 7 LYMPH NODES WITH METASTATIC PAPILLARY THYROID CARCINOMA.  3. THYROID, LEFT, HEMITHYROIDECTOMY: - CHRONIC THYROIDITIS  09/21/2007: RAI treatment with 159 mCi I-131 09/28/2007: Whole body scan negative, except expected uptake in the thyroidectomy bed 02/09/2009: Whole body scan negative 04/04/2011: Whole body scan negative 06/11/2012: Whole body scan negative  She developed posterior L neck pain >> a new whole-body scan was obtained on 05/04/2015 >> negative  Patient brought her records regarding her previous TSH levels, thyroglobulin and ATA, which we reviewed together: 01/2014: Tg 2.1, ATA 6.0 (0-0.9), TSH 0.060 08/18/2014: Tg <2.0 (RIA), ATA 6.7, TSH 0.060 03/29/2015: Tg <2.0 (RIA), ATA 8.9, TSH 0.230 05/04/2015: ATA 11.0, TSH 13.11 (*patient believes that this was not correct) 09/09/2015: ATA 22.8  From last visit:  Component     Latest Ref Rng 06/08/2015  Thyroglobulin Ab     <2 IU/mL 11 (H)  Free T4     0.60 - 1.60 ng/dL 0.54 (L)  TSH     0.35 - 4.50 uIU/mL 0.15 (L)  T3, Free     2.3 - 4.2 pg/mL 3.5  Thyroglobulin     2.8 - 40.9 ng/mL <0.1 (L)   She has postop hypothyroidism after her total  thyroidectomy in 2009; (tried Synthroid and did not help) now on Armour 90 alternating with 120 mg every other day - dose decreased at last visit, in 05/2015. She takes it: - fasting - with water - separated by 60 min from b'fast  - no calcium, iron, PPIs, multivitamins   I reviewed pt's thyroid tests - previously suppressed after her thyroidectomy - see above:  Lab Results  Component Value Date   TSH 0.57 07/06/2015   TSH 0.15* 06/08/2015   TSH 3.73 07/02/2007   FREET4 0.65 07/06/2015   FREET4 0.54* 06/08/2015   Pt denies feeling nodules in neck except a small left posterior nodule in the upper part of her back, no  hoarseness, dysphagia/odynophagia, SOB with lying down.  She has + FH of thyroid disorders in: mother. No FH of thyroid cancer.  No h/o radiation tx to head or neck other than RAI tx. No recent use of iodine supplements.  ROS: Constitutional: no weight gain/loss, + fatigue, no subjective hyperthermia/hypothermia Eyes: no blurry vision, no xerophthalmia ENT: no sore throat, no nodules palpated in throat, no dysphagia/odynophagia, no hoarseness Cardiovascular: no CP/SOB/palpitations/leg swelling Respiratory: no cough/SOB Gastrointestinal: no N/V/D/C Musculoskeletal: no muscle/joint aches Skin: + rashes Neurological: no tremors/numbness/tingling/dizziness  I reviewed pt's medications, allergies, PMH, social hx, family hx, and changes were documented in the history of present illness. Otherwise, unchanged from my initial visit note.  Past Medical History  Diagnosis Date  . Migraine aura, persistent   . Thyroid cancer [facility])    Past Surgical History  Procedure Laterality Date  . Breast  reduction surgery  1992  . Cesarean section    . Thyroidectomy  2009   Social History   Social History  . Marital Status: Married    Spouse Name: N/A  . Number of Children: 1   Occupational History  .  self-employed, Optometrist    Social History Main Topics  . Smoking  status:  former smoker, quit in 1998   . Smokeless tobacco: Not on file  . Alcohol Use: Yes     Comment: ocasionnaly, vault,, wine   . Drug Use: No  . Sexual Activity: Yes    Birth Control/ Protection: Condom   Current Outpatient Prescriptions on File Prior to Visit  Medication Sig Dispense Refill  . AFLURIA PRESERVATIVE FREE 0.5 ML SUSY inject 0.5 milliliter intramuscularly  0  . naproxen sodium (ANAPROX DS) 550 MG tablet Take 1 tablet (550 mg total) by mouth 2 (two) times daily with a meal. 60 tablet 2  . ondansetron (ZOFRAN-ODT) 4 MG disintegrating tablet Take 1 tablet (4 mg total) by mouth every 8 (eight) hours as needed. 20 tablet 3  . promethazine (PHENERGAN) 25 MG tablet Take 1 tablet (25 mg total) by mouth every 6 (six) hours as needed. 30 tablet 2  . SUMAtriptan-naproxen (TREXIMET) 85-500 MG per tablet Take 1 tablet by mouth every 2 (two) hours as needed for migraine. 9 tablet 11  . thyroid (ARMOUR) 60 MG tablet Take 90-120 mg by mouth daily before breakfast. Alternate 90 with 120 mg every other day    . topiramate (TOPAMAX) 200 MG tablet Take 1 tablet (200 mg total) by mouth daily. 30 tablet 11  . triamcinolone cream (KENALOG) 0.1 % Apply 1 application topically as needed.   0  . HYDROcodone-acetaminophen (NORCO/VICODIN) 5-325 MG per tablet Take 2 tablets by mouth every 4 (four) hours as needed. (Patient not taking: Reported on 10/05/2015) 40 tablet 0   No current facility-administered medications on file prior to visit.   No Known Allergies Family History  Problem Relation Age of Onset  . Hypertension Mother   . Diabetes Maternal Grandmother   . Cancer Maternal Grandmother     esopagus   PE: BP 104/68 mmHg  Pulse 88  Temp(Src) 98.1 F (36.7 C) (Oral)  Resp 12  Wt 176 lb (79.833 kg)  SpO2 97% Body mass index is 26.77 kg/(m^2). Wt Readings from Last 3 Encounters:  10/05/15 176 lb (79.833 kg)  06/08/15 181 lb 6.4 oz (82.283 kg)  07/18/14 173 lb (78.472 kg)    Constitutional: overweight, in NAD Eyes: PERRLA, EOMI, no exophthalmos ENT: moist mucous membranes, thyroidectomy scar healed, no masses felt in neck, no cervical lymphadenopathy Cardiovascular: RRR, No MRG Respiratory: CTA B Gastrointestinal: abdomen soft, NT, ND, BS+ Musculoskeletal: no deformities, strength intact in all 4 Skin: moist, warm, no rashes Neurological: no tremor with outstretched hands, DTR normal in all 4  ASSESSMENT: 1. Papillary thyroid cancer  2. Hypothyroidism  PLAN:  1. Papillary thyroid cancer, metastatic to lymph nodes - pt with h/o thyroidectomy by Dr Harlow Asa in 2009, followed by RAI treatment (159 mCi I-131) in 09/21/2007. She had now 5 whole-body scans there were negative, including the one obtained on 05/04/2015. Her thyroglobulin was 2.1 ng/mL in 01/2014, while afterwards it has been undetectable (less than 2.0 ng/mL) by the RAI assay from [facility]. This assay was used because she has positive ATA antibodies.  - at last visit, pt was very alarmed about the positive antibodies which appear to have increased over time, however  I reassured her that these are not as worrisome as an increasing thyroglobulin level. She is seen by integrative medicine and she brings a series of labs today showing positive IgG antibodies for Lyme's disease and EBV. We discussed that the positive antibodies could be a sign of immune reactivity, and not necessarily a sign that her cancer is back. I did suggest to have infectious disease look at her results to see if she has chronic Lyme disease or EBV or the labs simply show previous exposure - At today's visit, I would like to recheck her TFTs and we will check thyroglobulin and ATA at next visit (at that time, will use the [facility] LC/MS-MS assay, with a sensitivity of 0.2 ng/mL for thyroglobulin)  2. Patient with postoperative  hypothyroidism, on Armour 120 alternating with 90 mg every other day. She appears euthyroid - We discussed about  correct intake of Armour, fasting, with water, separated by at least 30 minutes from breakfast, and separated by more than 4 hours from calcium, iron, multivitamins, acid reflux medications (PPIs).She is taking it correctly. - will check thyroid tests today: TSH, free T4, Free T3 - If these are abnormal, she will need to return in 6 weeks for repeat labs - If these are normal, I will see her back in 6 months  CC:  Dr Osborne Casco (PCP) Dr Ronita Hipps (ObGyn) Mylinda Latina ([facility])  Component     Latest Ref Rng 10/05/2015  T4,Free(Direct)     0.82 - 1.77 ng/dL 0.83  TSH     0.450 - 4.500 uIU/mL 0.090 (L)  Triiodothyronine,Free,Serum     2.0 - 4.4 pg/mL 3.5   TSH very suppressed, which is unusual, since last level was normal on the same dose. We'll decrease Armour to 90 g daily. We'll need to repeat TFTs in 5-6 weeks.

## 2015-10-06 LAB — TSH: TSH: 0.09 u[IU]/mL — AB (ref 0.450–4.500)

## 2015-10-06 LAB — T3, FREE: T3, Free: 3.5 pg/mL (ref 2.0–4.4)

## 2015-10-06 LAB — T4, FREE: FREE T4: 0.83 ng/dL (ref 0.82–1.77)

## 2015-10-08 ENCOUNTER — Ambulatory Visit: Payer: BLUE CROSS/BLUE SHIELD | Admitting: Internal Medicine

## 2015-10-08 MED ORDER — THYROID 60 MG PO TABS: 90.0000 mg | ORAL_TABLET | Freq: Every day | ORAL | Status: DC

## 2015-10-12 ENCOUNTER — Other Ambulatory Visit: Payer: Self-pay

## 2015-10-12 DIAGNOSIS — Z1231 Encounter for screening mammogram for malignant neoplasm of breast: Secondary | ICD-10-CM

## 2015-10-12 DIAGNOSIS — E039 Hypothyroidism, unspecified: Secondary | ICD-10-CM | POA: Diagnosis not present

## 2015-10-12 DIAGNOSIS — E559 Vitamin D deficiency, unspecified: Secondary | ICD-10-CM | POA: Diagnosis not present

## 2015-10-12 DIAGNOSIS — D510 Vitamin B12 deficiency anemia due to intrinsic factor deficiency: Secondary | ICD-10-CM | POA: Diagnosis not present

## 2015-10-12 DIAGNOSIS — E569 Vitamin deficiency, unspecified: Secondary | ICD-10-CM | POA: Diagnosis not present

## 2015-10-12 DIAGNOSIS — R799 Abnormal finding of blood chemistry, unspecified: Secondary | ICD-10-CM | POA: Diagnosis not present

## 2015-10-19 DIAGNOSIS — H04123 Dry eye syndrome of bilateral lacrimal glands: Secondary | ICD-10-CM | POA: Diagnosis not present

## 2015-11-26 ENCOUNTER — Ambulatory Visit
Admission: RE | Admit: 2015-11-26 | Discharge: 2015-11-26 | Disposition: A | Discharge status: Home / Self Care | Payer: BLUE CROSS/BLUE SHIELD | Source: Ambulatory Visit

## 2015-11-26 DIAGNOSIS — Z1231 Encounter for screening mammogram for malignant neoplasm of breast: Secondary | ICD-10-CM | POA: Diagnosis not present

## 2015-11-28 DIAGNOSIS — F900 Attention-deficit hyperactivity disorder, predominantly inattentive type: Secondary | ICD-10-CM | POA: Diagnosis not present

## 2015-11-29 DIAGNOSIS — G44219 Episodic tension-type headache, not intractable: Secondary | ICD-10-CM | POA: Diagnosis not present

## 2015-11-29 DIAGNOSIS — G43009 Migraine without aura, not intractable, without status migrainosus: Secondary | ICD-10-CM | POA: Diagnosis not present

## 2016-01-14 DIAGNOSIS — M545 Low back pain: Secondary | ICD-10-CM | POA: Diagnosis not present

## 2016-01-14 DIAGNOSIS — N390 Urinary tract infection, site not specified: Secondary | ICD-10-CM | POA: Diagnosis not present

## 2016-01-14 DIAGNOSIS — R3915 Urgency of urination: Secondary | ICD-10-CM | POA: Diagnosis not present

## 2016-01-14 DIAGNOSIS — R350 Frequency of micturition: Secondary | ICD-10-CM | POA: Diagnosis not present

## 2016-01-23 DIAGNOSIS — M545 Low back pain: Secondary | ICD-10-CM | POA: Diagnosis not present

## 2016-01-23 DIAGNOSIS — Z6826 Body mass index (BMI) 26.0-26.9, adult: Secondary | ICD-10-CM | POA: Diagnosis not present

## 2016-02-27 DIAGNOSIS — F419 Anxiety disorder, unspecified: Secondary | ICD-10-CM | POA: Diagnosis not present

## 2016-02-27 DIAGNOSIS — F900 Attention-deficit hyperactivity disorder, predominantly inattentive type: Secondary | ICD-10-CM | POA: Diagnosis not present

## 2016-03-10 DIAGNOSIS — R5383 Other fatigue: Secondary | ICD-10-CM | POA: Diagnosis not present

## 2016-03-10 DIAGNOSIS — E039 Hypothyroidism, unspecified: Secondary | ICD-10-CM | POA: Diagnosis not present

## 2016-03-10 DIAGNOSIS — R5381 Other malaise: Secondary | ICD-10-CM | POA: Diagnosis not present

## 2016-03-10 DIAGNOSIS — A6920 Lyme disease, unspecified: Secondary | ICD-10-CM | POA: Diagnosis not present

## 2016-05-21 DIAGNOSIS — A6920 Lyme disease, unspecified: Secondary | ICD-10-CM | POA: Diagnosis not present

## 2016-05-21 DIAGNOSIS — R5383 Other fatigue: Secondary | ICD-10-CM | POA: Diagnosis not present

## 2016-05-21 DIAGNOSIS — B379 Candidiasis, unspecified: Secondary | ICD-10-CM | POA: Diagnosis not present

## 2016-05-21 DIAGNOSIS — R5381 Other malaise: Secondary | ICD-10-CM | POA: Diagnosis not present

## 2016-05-24 DIAGNOSIS — J00 Acute nasopharyngitis [common cold]: Secondary | ICD-10-CM | POA: Diagnosis not present

## 2016-05-24 DIAGNOSIS — J029 Acute pharyngitis, unspecified: Secondary | ICD-10-CM | POA: Diagnosis not present

## 2016-05-24 DIAGNOSIS — Z23 Encounter for immunization: Secondary | ICD-10-CM | POA: Diagnosis not present

## 2016-05-27 DIAGNOSIS — F900 Attention-deficit hyperactivity disorder, predominantly inattentive type: Secondary | ICD-10-CM | POA: Diagnosis not present

## 2016-06-23 DIAGNOSIS — G8929 Other chronic pain: Secondary | ICD-10-CM | POA: Diagnosis not present

## 2016-06-23 DIAGNOSIS — A6920 Lyme disease, unspecified: Secondary | ICD-10-CM | POA: Diagnosis not present

## 2016-06-23 DIAGNOSIS — R5383 Other fatigue: Secondary | ICD-10-CM | POA: Diagnosis not present

## 2016-06-23 DIAGNOSIS — R5381 Other malaise: Secondary | ICD-10-CM | POA: Diagnosis not present

## 2016-07-28 DIAGNOSIS — Z0000 Encounter for general adult medical examination without abnormal findings: Secondary | ICD-10-CM | POA: Diagnosis not present

## 2016-07-28 DIAGNOSIS — E038 Other specified hypothyroidism: Secondary | ICD-10-CM | POA: Diagnosis not present

## 2016-07-28 DIAGNOSIS — R8299 Other abnormal findings in urine: Secondary | ICD-10-CM | POA: Diagnosis not present

## 2016-07-28 DIAGNOSIS — R358 Other polyuria: Secondary | ICD-10-CM | POA: Diagnosis not present

## 2016-08-26 DIAGNOSIS — F900 Attention-deficit hyperactivity disorder, predominantly inattentive type: Secondary | ICD-10-CM | POA: Diagnosis not present

## 2016-09-08 DIAGNOSIS — F900 Attention-deficit hyperactivity disorder, predominantly inattentive type: Secondary | ICD-10-CM | POA: Diagnosis not present

## 2016-09-08 DIAGNOSIS — Z1389 Encounter for screening for other disorder: Secondary | ICD-10-CM | POA: Diagnosis not present

## 2016-09-08 DIAGNOSIS — E038 Other specified hypothyroidism: Secondary | ICD-10-CM | POA: Diagnosis not present

## 2016-09-08 DIAGNOSIS — Z0000 Encounter for general adult medical examination without abnormal findings: Secondary | ICD-10-CM | POA: Diagnosis not present

## 2016-09-08 DIAGNOSIS — L2089 Other atopic dermatitis: Secondary | ICD-10-CM | POA: Diagnosis not present

## 2016-09-08 DIAGNOSIS — Z85850 Personal history of malignant neoplasm of thyroid: Secondary | ICD-10-CM | POA: Diagnosis not present

## 2016-09-15 DIAGNOSIS — A6920 Lyme disease, unspecified: Secondary | ICD-10-CM | POA: Diagnosis not present

## 2016-09-15 DIAGNOSIS — E039 Hypothyroidism, unspecified: Secondary | ICD-10-CM | POA: Diagnosis not present

## 2016-09-15 DIAGNOSIS — R5381 Other malaise: Secondary | ICD-10-CM | POA: Diagnosis not present

## 2016-09-15 DIAGNOSIS — R5383 Other fatigue: Secondary | ICD-10-CM | POA: Diagnosis not present

## 2016-09-15 DIAGNOSIS — G8929 Other chronic pain: Secondary | ICD-10-CM | POA: Diagnosis not present

## 2016-10-02 DIAGNOSIS — Z01419 Encounter for gynecological examination (general) (routine) without abnormal findings: Secondary | ICD-10-CM | POA: Diagnosis not present

## 2016-10-02 DIAGNOSIS — Z6827 Body mass index (BMI) 27.0-27.9, adult: Secondary | ICD-10-CM | POA: Diagnosis not present

## 2016-10-09 DIAGNOSIS — E559 Vitamin D deficiency, unspecified: Secondary | ICD-10-CM | POA: Diagnosis not present

## 2016-10-09 DIAGNOSIS — G8929 Other chronic pain: Secondary | ICD-10-CM | POA: Diagnosis not present

## 2016-10-09 DIAGNOSIS — N943 Premenstrual tension syndrome: Secondary | ICD-10-CM | POA: Diagnosis not present

## 2016-10-09 DIAGNOSIS — A6920 Lyme disease, unspecified: Secondary | ICD-10-CM | POA: Diagnosis not present

## 2016-10-09 DIAGNOSIS — E039 Hypothyroidism, unspecified: Secondary | ICD-10-CM | POA: Diagnosis not present

## 2016-10-23 DIAGNOSIS — A6920 Lyme disease, unspecified: Secondary | ICD-10-CM | POA: Diagnosis not present

## 2016-10-23 DIAGNOSIS — R5381 Other malaise: Secondary | ICD-10-CM | POA: Diagnosis not present

## 2016-10-23 DIAGNOSIS — E039 Hypothyroidism, unspecified: Secondary | ICD-10-CM | POA: Diagnosis not present

## 2016-10-23 DIAGNOSIS — R5383 Other fatigue: Secondary | ICD-10-CM | POA: Diagnosis not present

## 2016-10-30 DIAGNOSIS — H20011 Primary iridocyclitis, right eye: Secondary | ICD-10-CM | POA: Diagnosis not present

## 2016-11-04 DIAGNOSIS — F900 Attention-deficit hyperactivity disorder, predominantly inattentive type: Secondary | ICD-10-CM | POA: Diagnosis not present

## 2016-11-04 DIAGNOSIS — F419 Anxiety disorder, unspecified: Secondary | ICD-10-CM | POA: Diagnosis not present

## 2016-12-29 DIAGNOSIS — F900 Attention-deficit hyperactivity disorder, predominantly inattentive type: Secondary | ICD-10-CM | POA: Diagnosis not present

## 2017-01-23 DIAGNOSIS — E559 Vitamin D deficiency, unspecified: Secondary | ICD-10-CM | POA: Diagnosis not present

## 2017-01-23 DIAGNOSIS — E039 Hypothyroidism, unspecified: Secondary | ICD-10-CM | POA: Diagnosis not present

## 2017-01-23 DIAGNOSIS — A6920 Lyme disease, unspecified: Secondary | ICD-10-CM | POA: Diagnosis not present

## 2017-01-23 DIAGNOSIS — N926 Irregular menstruation, unspecified: Secondary | ICD-10-CM | POA: Diagnosis not present

## 2017-01-23 DIAGNOSIS — E2749 Other adrenocortical insufficiency: Secondary | ICD-10-CM | POA: Diagnosis not present

## 2017-02-04 DIAGNOSIS — R5383 Other fatigue: Secondary | ICD-10-CM | POA: Diagnosis not present

## 2017-02-04 DIAGNOSIS — R5381 Other malaise: Secondary | ICD-10-CM | POA: Diagnosis not present

## 2017-02-04 DIAGNOSIS — E2749 Other adrenocortical insufficiency: Secondary | ICD-10-CM | POA: Diagnosis not present

## 2017-02-04 DIAGNOSIS — E039 Hypothyroidism, unspecified: Secondary | ICD-10-CM | POA: Diagnosis not present

## 2017-02-05 DIAGNOSIS — F900 Attention-deficit hyperactivity disorder, predominantly inattentive type: Secondary | ICD-10-CM | POA: Diagnosis not present

## 2017-02-05 DIAGNOSIS — F419 Anxiety disorder, unspecified: Secondary | ICD-10-CM | POA: Diagnosis not present

## 2017-02-12 DIAGNOSIS — N390 Urinary tract infection, site not specified: Secondary | ICD-10-CM | POA: Diagnosis not present

## 2017-02-15 DIAGNOSIS — R8299 Other abnormal findings in urine: Secondary | ICD-10-CM | POA: Diagnosis not present

## 2017-02-15 DIAGNOSIS — N390 Urinary tract infection, site not specified: Secondary | ICD-10-CM | POA: Diagnosis not present

## 2017-02-15 DIAGNOSIS — A499 Bacterial infection, unspecified: Secondary | ICD-10-CM | POA: Diagnosis not present

## 2017-02-15 DIAGNOSIS — N911 Secondary amenorrhea: Secondary | ICD-10-CM | POA: Diagnosis not present

## 2017-02-15 DIAGNOSIS — R300 Dysuria: Secondary | ICD-10-CM | POA: Diagnosis not present

## 2017-02-16 ENCOUNTER — Encounter (HOSPITAL_COMMUNITY): Payer: Self-pay | Admitting: Emergency Medicine

## 2017-02-16 ENCOUNTER — Ambulatory Visit (HOSPITAL_COMMUNITY)
Admission: EM | Admit: 2017-02-16 | Discharge: 2017-02-16 | Disposition: A | Discharge status: Home / Self Care | Payer: BLUE CROSS/BLUE SHIELD | Attending: Internal Medicine | Admitting: Internal Medicine

## 2017-02-16 DIAGNOSIS — N3090 Cystitis, unspecified without hematuria: Secondary | ICD-10-CM

## 2017-02-16 DIAGNOSIS — R300 Dysuria: Secondary | ICD-10-CM | POA: Diagnosis not present

## 2017-02-16 DIAGNOSIS — R350 Frequency of micturition: Secondary | ICD-10-CM | POA: Diagnosis not present

## 2017-02-16 DIAGNOSIS — N12 Tubulo-interstitial nephritis, not specified as acute or chronic: Secondary | ICD-10-CM | POA: Diagnosis not present

## 2017-02-16 LAB — POCT URINALYSIS DIP (DEVICE)
Bilirubin Urine: NEGATIVE
GLUCOSE, UA: NEGATIVE mg/dL
Ketones, ur: 15 mg/dL — AB
NITRITE: NEGATIVE
PROTEIN: 30 mg/dL — AB
Specific Gravity, Urine: 1.01 (ref 1.005–1.030)
UROBILINOGEN UA: 1 mg/dL (ref 0.0–1.0)
pH: 8.5 — ABNORMAL HIGH (ref 5.0–8.0)

## 2017-02-16 LAB — POCT I-STAT, CHEM 8
BUN: 15 mg/dL (ref 6–20)
Calcium, Ion: 1.13 mmol/L — ABNORMAL LOW (ref 1.15–1.40)
Chloride: 100 mmol/L — ABNORMAL LOW (ref 101–111)
Creatinine, Ser: 0.7 mg/dL (ref 0.44–1.00)
Glucose, Bld: 113 mg/dL — ABNORMAL HIGH (ref 65–99)
HCT: 42 % (ref 36.0–46.0)
HEMOGLOBIN: 14.3 g/dL (ref 12.0–15.0)
POTASSIUM: 3.9 mmol/L (ref 3.5–5.1)
SODIUM: 136 mmol/L (ref 135–145)
TCO2: 24 mmol/L (ref 22–32)

## 2017-02-16 NOTE — ED Triage Notes (Signed)
Pt here for OB for fever and UTI sx x 4 days even though taking antibiotics and had antibiotic injection yesterday; pt sts feeling worse

## 2017-02-16 NOTE — ED Provider Notes (Signed)
[facility]    CSN: [id number] Arrival date & time: 02/16/17  1515     History   Chief Complaint Chief Complaint  Patient presents with  . Fever  . Urinary Tract Infection    HPI Sheila Curtis is a 46 y.o. female.   46 year old female comes in for fever and continued body aches and urinary symptoms after being treated for UTI. Patient states that symptoms first started 4 days ago when she was out of state with frequency, dysuria, and low abdominal pressure. States went to an urgent care with positive urine dipstick, was put on Bactrim. Patient states symptoms improved after 2 days on Bactrim, but started feeling worse yesterday, and went to an urgent care in town, was switched medications to [facility], and was given a Rocephin injection in office. Patient continues to have fevers, Tmax 103. With generalized body aches and continued lower abdominal pressure. Has felt some nausea, no vomiting. Denies diarrhea, constipation. Has also noticed some lower back pressure and pain. Denies bloody urine.       Past Medical History:  Diagnosis Date  . Migraine aura, persistent   . Thyroid cancer [facility])     Patient Active Problem List   Diagnosis Date Noted  . Thyroid cancer ([place]) 06/08/2015  . Postsurgical hypothyroidism 06/08/2015  . Migraine 08/17/2012  . URI 07/16/2010  . HYPOGLYCEMIA, REACTIVE 07/01/2007  . MIGRAINE HEADACHE 07/01/2007  . ORTHOSTATIC HYPOTENSION 07/01/2007  . FATIGUE 07/01/2007    Past Surgical History:  Procedure Laterality Date  . BREAST REDUCTION SURGERY  1992  . CESAREAN SECTION    . THYROIDECTOMY  2009    OB History    No data available       Home Medications    Prior to Admission medications   Medication Sig Start Date End Date Taking? Authorizing Provider  nitrofurantoin (MACRODANTIN) 100 MG capsule Take 100 mg by mouth 2 (two) times daily.   Yes [provider]  sulfamethoxazole-trimethoprim (BACTRIM DS,SEPTRA DS)  800-160 MG tablet Take 1 tablet by mouth 2 (two) times daily.   Yes [provider]  AFLURIA PRESERVATIVE FREE 0.5 ML SUSY inject 0.5 milliliter intramuscularly 03/10/15   [provider]  HYDROcodone-acetaminophen (NORCO/VICODIN) 5-325 MG per tablet Take 2 tablets by mouth every 4 (four) hours as needed. Patient not taking: Reported on 10/05/2015 07/18/14   Olegario Messier, NP  naproxen sodium (ANAPROX DS) 550 MG tablet Take 1 tablet (550 mg total) by mouth 2 (two) times daily with a meal. 07/18/14   Barefoot, Connye Burkitt, NP  ondansetron (ZOFRAN-ODT) 4 MG disintegrating tablet Take 1 tablet (4 mg total) by mouth every 8 (eight) hours as needed. 07/18/14   BarefootConnye Burkitt, NP  promethazine (PHENERGAN) 25 MG tablet Take 1 tablet (25 mg total) by mouth every 6 (six) hours as needed. 07/18/14   BarefootConnye Burkitt, NP  SUMAtriptan (IMITREX) 100 MG tablet Take 100 mg by mouth as needed.  09/10/15   [provider]  SUMAtriptan-naproxen (TREXIMET) 85-500 MG per tablet Take 1 tablet by mouth every 2 (two) hours as needed for migraine. 07/18/14   Olegario Messier, NP  thyroid (ARMOUR) 60 MG tablet Take 1.5 tablets (90 mg total) by mouth daily before breakfast. 10/08/15   Philemon Kingdom, MD  topiramate (TOPAMAX) 200 MG tablet Take 1 tablet (200 mg total) by mouth daily. 07/18/14   BarefootConnye Burkitt, NP  triamcinolone cream (KENALOG) 0.1 % Apply 1 application topically as needed.  06/16/14   [provider]  VYVANSE 70 MG capsule Take 70 mg by mouth daily.  09/17/15   [provider]    Family History Family History  Problem Relation Age of Onset  . Hypertension Mother   . Diabetes Maternal Grandmother   . Cancer Maternal Grandmother        esopagus    Social History Social History  Substance Use Topics  . Smoking status: Never Smoker  . Smokeless tobacco: Not on file  . Alcohol use Yes     Comment: ocasionnaly     Allergies   Patient has no known  allergies.   Review of Systems Review of Systems  Reason unable to perform ROS: see HPI as above.     Physical Exam Triage Vital Signs ED Triage Vitals  Enc Vitals Group     BP 02/16/17 1627 103/87     Pulse Rate 02/16/17 1627 (!) 106     Resp 02/16/17 1627 20     Temp 02/16/17 1627 98.6 F (37 C)     Temp Source 02/16/17 1627 Oral     SpO2 02/16/17 1627 99 %     Weight 02/16/17 1628 178 lb (80.7 kg)     Height 02/16/17 1628 5\' 8"  (1.727 m)     Head Circumference --      Peak Flow --      Pain Score 02/16/17 1628 10     Pain Loc --      Pain Edu? --      Excl. in [place]? --    No data found.   Updated Vital Signs BP 103/87 (BP Location: Right Arm)   Pulse (!) 106   Temp 99.1 F (37.3 C) (Oral)   Resp 20   Ht 5\' 8"  (1.727 m)   Wt 178 lb (80.7 kg)   SpO2 99%   BMI 27.06 kg/m      Physical Exam  Constitutional: She is oriented to person, place, and time. She appears well-developed and well-nourished.  Tired but non toxic  Eyes: Pupils are equal, round, and reactive to light. Conjunctivae are normal.  Neck: Normal range of motion. Neck supple.  Cardiovascular: Normal rate, regular rhythm and normal heart sounds.  Exam reveals no gallop and no friction rub.   No murmur heard. Pulmonary/Chest: Effort normal and breath sounds normal. She has no wheezes. She has no rales.  Abdominal: Soft. Bowel sounds are normal. She exhibits no distension. There is no tenderness. There is no rebound, no guarding and no CVA tenderness.  Neurological: She is alert and oriented to person, place, and time.  Skin: Skin is warm and dry.     UC Treatments / Results  Labs (all labs ordered are listed, but only abnormal results are displayed) Labs Reviewed  POCT URINALYSIS DIP (DEVICE) - Abnormal; Notable for the following:       Result Value   Ketones, ur 15 (*)    Hgb urine dipstick TRACE (*)    pH 8.5 (*)    Protein, ur 30 (*)    Leukocytes, UA SMALL (*)    All other components  within normal limits  POCT I-STAT, CHEM 8 - Abnormal; Notable for the following:    Chloride 100 (*)    Glucose, Bld 113 (*)    Calcium, Ion 1.13 (*)    All other components within normal limits    EKG  EKG Interpretation None       Radiology No results  found.  Procedures Procedures (including critical care time)  Medications Ordered in UC Medications - No data to display   Initial Impression / Assessment and Plan / UC Course  I have reviewed the triage vital signs and the nursing notes.  Pertinent labs & imaging results that were available during my care of the patient were reviewed by me and considered in my medical decision making (see chart for details).    Creatinine within normal limits. Given medication change less than 24 hours, will have patient continue for now and monitor symptoms. Push fluids. Tylenol/motrin for fever and body aches. Return precautions given.   Case discussed with Dr Valere Dross, who agrees to plan.   Final Clinical Impressions(s) / UC Diagnoses   Final diagnoses:  Cystitis    New Prescriptions Discharge Medication List as of 02/16/2017  6:16 PM       Ok Edwards, PA-C 02/16/17 1850

## 2017-02-16 NOTE — Discharge Instructions (Signed)
Kidney function within normal limits. Continue Macrobid as directed. Keep hydrated, urine should be cleared to yellow in color. Tylenol/Motrin for pain and fever. If symptoms continue, worsens, go to the emergency department for further evaluation.

## 2017-02-18 DIAGNOSIS — F900 Attention-deficit hyperactivity disorder, predominantly inattentive type: Secondary | ICD-10-CM | POA: Diagnosis not present

## 2017-06-17 DIAGNOSIS — F419 Anxiety disorder, unspecified: Secondary | ICD-10-CM | POA: Diagnosis not present

## 2017-06-17 DIAGNOSIS — F900 Attention-deficit hyperactivity disorder, predominantly inattentive type: Secondary | ICD-10-CM | POA: Diagnosis not present

## 2017-08-05 DIAGNOSIS — F419 Anxiety disorder, unspecified: Secondary | ICD-10-CM | POA: Diagnosis not present

## 2017-08-05 DIAGNOSIS — F902 Attention-deficit hyperactivity disorder, combined type: Secondary | ICD-10-CM | POA: Diagnosis not present

## 2017-08-13 IMAGING — NM NM [ID] THYROID CANCER METS WHOLE BODY W/ THYROGEN
6 series · 6 of 6 positions shown · non-contrast
Comparison: Whole-body scan 06/11/2012

CLINICAL DATA: Thyroid cancer. Thyroidectomy and remnant
ablation2002.

EXAM:
THYROGEN-STIMULATED R-GWG WHOLE BODY SCAN
TECHNIQUE: The patient received 0.9 mg Thyrogen intramuscularly every 24 hours
for two doses. On the third day the patient returned and received
the radiopharmaceutical, per orally. On the fifth day, the patient
returned and whole body planar images were obtained in the anterior
and posterior projections.
RADIOPHARMACEUTICALS:  4.0 mCi R-GWG sodium iodide orally

[Series 1: marker · 4.14mm/px · 1 of 1 slices shown (1 of 2)]
[im 1/1  full-range]
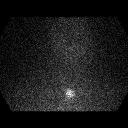

[Series 1: marker · 4.14mm/px · 1 of 1 slices shown (2 of 2)]
[im 1/1]
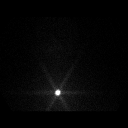

[Series 2: static thyroid no marker · 4.14mm/px · 1 of 1 slices shown (1 of 2)]
[im 1/1  full-range]
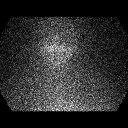

[Series 2: static thyroid no marker · 4.14mm/px · 1 of 1 slices shown (2 of 2)]
[im 1/1  full-range]
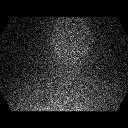

[Series 3: i131 whole body · 2.66mm/px · 1 of 1 slices shown (1 of 2)]
[im 1/1  full-range]
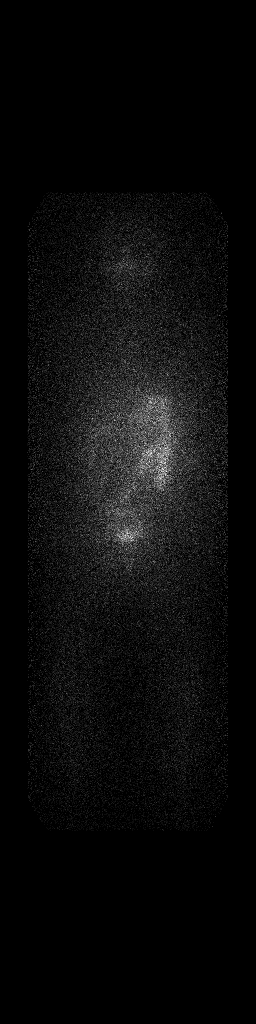

[Series 3: i131 whole body · 2.66mm/px · 1 of 1 slices shown (2 of 2)]
[im 1/1  full-range]
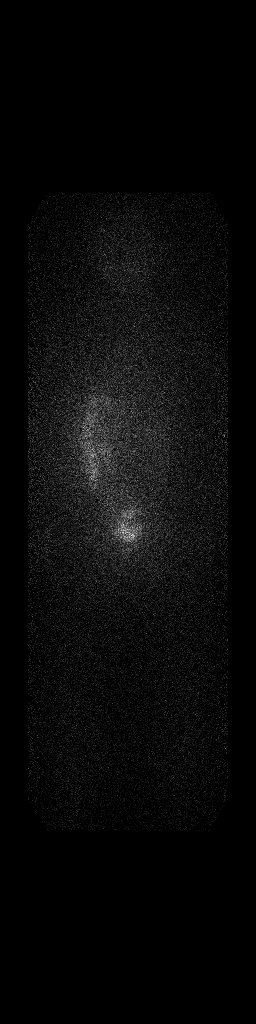

[6 of 6 positions shown; findings below may reference images not displayed]

FINDINGS: No focal activity in thyroid bed. No abnormal uptake on the
whole-body scan to suggest distant metastasis. Physiologic uptake in
the GU and GI tract.
IMPRESSION: No evidence of local thyroid cancer recurrence or distant
metastasis.

## 2017-09-09 DIAGNOSIS — Z0000 Encounter for general adult medical examination without abnormal findings: Secondary | ICD-10-CM | POA: Diagnosis not present

## 2017-09-09 DIAGNOSIS — E038 Other specified hypothyroidism: Secondary | ICD-10-CM | POA: Diagnosis not present

## 2017-09-16 DIAGNOSIS — Z0000 Encounter for general adult medical examination without abnormal findings: Secondary | ICD-10-CM | POA: Diagnosis not present

## 2017-09-16 DIAGNOSIS — Z85850 Personal history of malignant neoplasm of thyroid: Secondary | ICD-10-CM | POA: Diagnosis not present

## 2017-09-16 DIAGNOSIS — Z1389 Encounter for screening for other disorder: Secondary | ICD-10-CM | POA: Diagnosis not present

## 2017-09-16 DIAGNOSIS — L2089 Other atopic dermatitis: Secondary | ICD-10-CM | POA: Diagnosis not present

## 2017-09-16 DIAGNOSIS — E038 Other specified hypothyroidism: Secondary | ICD-10-CM | POA: Diagnosis not present

## 2017-09-16 DIAGNOSIS — F900 Attention-deficit hyperactivity disorder, predominantly inattentive type: Secondary | ICD-10-CM | POA: Diagnosis not present

## 2017-09-18 DIAGNOSIS — Z1212 Encounter for screening for malignant neoplasm of rectum: Secondary | ICD-10-CM | POA: Diagnosis not present

## 2017-10-13 DIAGNOSIS — Z6827 Body mass index (BMI) 27.0-27.9, adult: Secondary | ICD-10-CM | POA: Diagnosis not present

## 2017-10-13 DIAGNOSIS — Z01419 Encounter for gynecological examination (general) (routine) without abnormal findings: Secondary | ICD-10-CM | POA: Diagnosis not present

## 2017-10-19 DIAGNOSIS — Z1231 Encounter for screening mammogram for malignant neoplasm of breast: Secondary | ICD-10-CM | POA: Diagnosis not present

## 2018-03-09 ENCOUNTER — Encounter: Payer: Self-pay | Admitting: Internal Medicine

## 2018-03-09 DIAGNOSIS — F900 Attention-deficit hyperactivity disorder, predominantly inattentive type: Secondary | ICD-10-CM | POA: Diagnosis not present

## 2018-03-09 DIAGNOSIS — G43909 Migraine, unspecified, not intractable, without status migrainosus: Secondary | ICD-10-CM | POA: Diagnosis not present

## 2018-03-09 DIAGNOSIS — E039 Hypothyroidism, unspecified: Secondary | ICD-10-CM | POA: Diagnosis not present

## 2018-03-09 DIAGNOSIS — Z85850 Personal history of malignant neoplasm of thyroid: Secondary | ICD-10-CM | POA: Diagnosis not present

## 2018-03-09 DIAGNOSIS — E038 Other specified hypothyroidism: Secondary | ICD-10-CM | POA: Diagnosis not present

## 2018-03-23 ENCOUNTER — Ambulatory Visit: Payer: BLUE CROSS/BLUE SHIELD | Admitting: Internal Medicine

## 2018-03-23 ENCOUNTER — Encounter: Payer: Self-pay | Admitting: Internal Medicine

## 2018-03-23 VITALS — BP 128/86 | HR 105 | Ht 68.31 in | Wt 187.0 lb

## 2018-03-23 DIAGNOSIS — C73 Malignant neoplasm of thyroid gland: Secondary | ICD-10-CM | POA: Diagnosis not present

## 2018-03-23 DIAGNOSIS — E890 Postprocedural hypothyroidism: Secondary | ICD-10-CM | POA: Diagnosis not present

## 2018-03-23 MED ORDER — THYROID 60 MG PO TABS: 120.0000 mg | ORAL_TABLET | Freq: Every day | ORAL | 3 refills | Status: DC

## 2018-03-23 NOTE — Progress Notes (Signed)
Patient ID: Sheila Curtis, female   DOB: Jan 28, 1971, 47 y.o.   MRN: [id number]   HPI  Sheila Curtis is a 47 y.o.-year-old female, returning for f/u for metastatic papillary thyroid cancer  and postsurgical hypothyroidism. Last visit 2.18m years ago. She also sees Dr. Mylinda Latina with [facility] in [place].  Pt. has been dx with metastatic Papillary ThyCa in 2009. 08/18/2007: Total thyroidectomy (Dr Harlow Asa) Pathology: 1. THYROID, RIGHT LOBE, HEMITHYROIDECTOMY: - PAPILLARY THYROID CARCINOMA. (1.8 CM IN GREATEST DIMENSION). - CHRONIC THYROIDITIS. - ONE BENIGN LYMPH NODE (ISTHMUS). - ONE BENIGN PARATHYROID GLAND IDENTIFIED. - INKED MARGINS NEGATIVE FOR TUMOR.  2. LYMPH NODE, CENTRAL COMPARTMENT, RESECTION: - 3 OF 7 LYMPH NODES WITH METASTATIC PAPILLARY THYROID CARCINOMA.  3. THYROID, LEFT, HEMITHYROIDECTOMY: - CHRONIC THYROIDITIS  09/21/2007: RAI treatment with 159 mCi I-131 09/28/2007: Whole body scan negative, except expected uptake in the thyroidectomy bed 02/09/2009: Whole body scan negative 04/04/2011: Whole body scan negative 06/11/2012: Whole body scan negative  She developed posterior L neck pain >> a new whole-body scan was obtained on 05/04/2015 >> negative  Reviewed Tg + ATA levels: 01/2014: Tg 2.1, ATA 6.0 (0-0.9), TSH 0.060 08/18/2014: Tg <2.0 (RIA), ATA 6.7, TSH 0.060 03/29/2015: Tg <2.0 (RIA), ATA 8.9, TSH 0.230 05/04/2015: ATA 11.0, TSH 13.11 (*patient believes that this was not correct) Component     Latest Ref Rng 06/08/2015  Thyroglobulin Ab     <2 IU/mL 11 (H)  Free T4     0.60 - 1.60 ng/dL 0.54 (L)  TSH     0.35 - 4.50 uIU/mL 0.15 (L)  T3, Free     2.3 - 4.2 pg/mL 3.5  Thyroglobulin     2.8 - 40.9 ng/mL <0.1 (L)   09/09/2015: ATA 22.8 03/16/2018: Tg (RIA) 2.7, ATA 9.1  She has postop hypothyroidism after her total thyroidectomy in 2009; (tried Synthroid and did not help) - currently on Armour 120 mg daily + LT4  50 mcg daily (added by her integrative medicine provider): - in am - fasting - at least 30 min from b'fast - no Ca, Fe, PPIs - + MVI at night - not on Biotin  Reviewed TFTs: 03/16/2018: TSH 0.84 (0.4-4.2), fT4 0.7 (0.8-1.8) Lab Results  Component Value Date   TSH 0.090 (L) 10/05/2015   TSH 0.57 07/06/2015   TSH 0.15 (L) 06/08/2015   TSH 3.73 07/02/2007   FREET4 0.83 10/05/2015   FREET4 0.65 07/06/2015   FREET4 0.54 (L) 06/08/2015   Pt denies: - feeling nodules in neck - hoarseness - dysphagia - choking - SOB with lying down  She has + FH of thyroid disorders in: mother. No FH of thyroid cancer. No h/o radiation tx to head or neck.  No Biotin use. No recent steroids use.   Seeing Dr. Tye Savoy in [place] -he started her on pregnenolone (?)  And also added levothyroxine to her Armour dose.  ROS: Constitutional: + weight gain/no weight loss, no fatigue, no subjective hyperthermia, no subjective hypothermia Eyes: no blurry vision, no xerophthalmia ENT: no sore throat, + see HPI Cardiovascular: no CP/no SOB/no palpitations/no leg swelling Respiratory: no cough/no SOB/no wheezing Gastrointestinal: no N/no V/no D/no C/no acid reflux Musculoskeletal: no muscle aches/no joint aches Skin: no rashes, no hair loss Neurological: no tremors/no numbness/no tingling/no dizziness  I reviewed pt's medications, allergies, PMH, social hx, family hx, and changes were documented in the history of present illness. Otherwise, unchanged from my initial visit note.  Past Medical History:  Diagnosis Date  .  Migraine aura, persistent   . Thyroid cancer [facility])    Past Surgical History:  Procedure Laterality Date  . BREAST REDUCTION SURGERY  1992  . CESAREAN SECTION    . THYROIDECTOMY  2009   Social History   Social History  . Marital Status: Married    Spouse Name: N/A  . Number of Children: 1   Occupational History  .  self-employed, Optometrist    Social History Main Topics   . Smoking status:  former smoker, quit in 1998   . Smokeless tobacco: Not on file  . Alcohol Use: Yes     Comment: ocasionnaly, vault,, wine   . Drug Use: No  . Sexual Activity: Yes    Birth Control/ Protection: Condom   Current Outpatient Medications on File Prior to Visit  Medication Sig Dispense Refill  . AFLURIA PRESERVATIVE FREE 0.5 ML SUSY inject 0.5 milliliter intramuscularly  0  . HYDROcodone-acetaminophen (NORCO/VICODIN) 5-325 MG per tablet Take 2 tablets by mouth every 4 (four) hours as needed. (Patient not taking: Reported on 10/05/2015) 40 tablet 0  . naproxen sodium (ANAPROX DS) 550 MG tablet Take 1 tablet (550 mg total) by mouth 2 (two) times daily with a meal. 60 tablet 2  . nitrofurantoin (MACRODANTIN) 100 MG capsule Take 100 mg by mouth 2 (two) times daily.    . ondansetron (ZOFRAN-ODT) 4 MG disintegrating tablet Take 1 tablet (4 mg total) by mouth every 8 (eight) hours as needed. 20 tablet 3  . promethazine (PHENERGAN) 25 MG tablet Take 1 tablet (25 mg total) by mouth every 6 (six) hours as needed. 30 tablet 2  . sulfamethoxazole-trimethoprim (BACTRIM DS,SEPTRA DS) 800-160 MG tablet Take 1 tablet by mouth 2 (two) times daily.    . SUMAtriptan (IMITREX) 100 MG tablet Take 100 mg by mouth as needed.   1  . SUMAtriptan-naproxen (TREXIMET) 85-500 MG per tablet Take 1 tablet by mouth every 2 (two) hours as needed for migraine. 9 tablet 11  . thyroid (ARMOUR) 60 MG tablet Take 1.5 tablets (90 mg total) by mouth daily before breakfast.    . topiramate (TOPAMAX) 200 MG tablet Take 1 tablet (200 mg total) by mouth daily. 30 tablet 11  . triamcinolone cream (KENALOG) 0.1 % Apply 1 application topically as needed.   0  . VYVANSE 70 MG capsule Take 70 mg by mouth daily.   0   No current facility-administered medications on file prior to visit.    No Known Allergies Family History  Problem Relation Age of Onset  . Hypertension Mother   . Diabetes Maternal Grandmother   . Cancer  Maternal Grandmother        esopagus   PE: BP 128/86   Pulse (!) 105   Ht 5' 8.31" (1.735 m)   Wt 187 lb (84.8 kg)   SpO2 98%   BMI 28.18 kg/m  Body mass index is 28.18 kg/m. Wt Readings from Last 3 Encounters:  03/23/18 187 lb (84.8 kg)  02/16/17 178 lb (80.7 kg)  10/05/15 176 lb (79.8 kg)   Constitutional: overweight, in NAD Eyes: PERRLA, EOMI, no exophthalmos ENT: moist mucous membranes, no thyroid masses palpable, thyroidectomy scar healed, no cervical lymphadenopathy Cardiovascular: tachycardia, RR, No MRG Respiratory: CTA B Gastrointestinal: abdomen soft, NT, ND, BS+ Musculoskeletal: no deformities, strength intact in all 4 Skin: moist, warm, no rashes Neurological: no tremor with outstretched hands, DTR normal in all 4  ASSESSMENT: 1. Papillary thyroid cancer  2. Hypothyroidism  3.  Weight  gain  PLAN:  1. Papillary thyroid cancer, metastatic to lymph nodes -Patient with history of thyroidectomy by Dr. Harlow Asa in 2009, followed by RAI treatment (159 mCi) in 09/2017.  Afterwards, she had 5 whole-body scans that were negative for recurrences, with last being in 04/2015.  Her thyroglobulin levels were elevated until 01/2014 (when her Tg was 2.1), but became undetectable by the RAI assay from [facility] afterwards.  We chose to follow her by this assay because she has positive ATA antibodies. -Patient was lost for follow-up after her last visit 2.5 years ago. -Her PCP was following her thyroglobulin and TFTs and recent labs (03/16/2018) showed a persistently high thyroglobulin: Tg 2.7 ng/mL (by Tg-RIA assay) with detectable antibodies of 9.1 IU/mL of note, this is the assay that yielded high thyroglobulin for her in the past.  We discussed the due to her ATA antibodies, the best test for her is the [facility] LC/MS-MS assay, with a sensitivity of 0.2 ng/mL for thyroglobulin.  This is the assay of choice for patients with positive ATA antibodies. -If thyroglobulin is higher by this  assay, next that would be a whole-body scan and if negative a neck ultrasound.  We have to be mindful of price, she usually has a high co-pay for the whole body scan.  2. Patient with postop hypothyroidism - latest thyroid labs reviewed with pt >> normal at last visit with PCP last mo - she is now on Armour 120 mg daily + LT4 50 mcg daily.  Of note, at last visit, she was taking 90 mg of Armour with normal tests.  She also had normal TFTs on the current dose of Armour + LT4 (?).  Therefore, for now, we will continue this dose. - pt feels good on this dose, with exception of fatigue and also mentions that she gained weight. - we discussed about taking the thyroid hormone every day, with water, >30 minutes before breakfast, separated by >4 hours from acid reflux medications, calcium, iron, multivitamins. Pt. is taking it correctly. - will repeat labs at next visit - RTC in 6 mo  3.  Weight gain -Possibly related to her pregnenolone.  I advised her to discuss with Dr. Tye Savoy to see why this was started.  We discussed that she may need to stop this.  Component     Latest Ref Rng & Units 03/23/2018  Thyroglobulin Antibody     0.0 - 0.9 IU/mL 10.2 (H)  Thyroglobulin by LCMS     1.5 - 38.5 ng/mL <0.2 (L)  Her thyroglobulin antibodies are still high, while thyroglobulin is now undetectable by the [facility] MS assay.    Philemon Kingdom, MD PhD [facility]

## 2018-03-23 NOTE — Patient Instructions (Addendum)
Please stop at the lab.  Please continue: - Armour 120 mg daily - Levothyroxine 50 mcg daily  Take the thyroid hormone every day, with water, at least 30 minutes before breakfast, separated by at least 4 hours from: - acid reflux medications - calcium - iron - multivitamins  Please come back for a follow-up appointment in 6 months.

## 2018-03-26 LAB — TGAB+THYROGLOBULIN IMA OR LCMS: Thyroglobulin Antibody: 10.2 IU/mL — ABNORMAL HIGH (ref 0.0–0.9)

## 2018-03-26 LAB — THYROGLOBULIN BY LCMS: Thyroglobulin by LCMS: <0.2 ng/mL — ABNORMAL LOW (ref 1.5–38.5)

## 2018-03-29 ENCOUNTER — Encounter: Payer: Self-pay | Admitting: Internal Medicine

## 2018-03-31 DIAGNOSIS — Z6827 Body mass index (BMI) 27.0-27.9, adult: Secondary | ICD-10-CM | POA: Diagnosis not present

## 2018-03-31 DIAGNOSIS — C73 Malignant neoplasm of thyroid gland: Secondary | ICD-10-CM | POA: Diagnosis not present

## 2018-03-31 DIAGNOSIS — E890 Postprocedural hypothyroidism: Secondary | ICD-10-CM | POA: Diagnosis not present

## 2018-04-01 ENCOUNTER — Other Ambulatory Visit: Payer: Self-pay | Admitting: Endocrinology

## 2018-04-01 DIAGNOSIS — C73 Malignant neoplasm of thyroid gland: Secondary | ICD-10-CM

## 2018-04-10 DIAGNOSIS — D229 Melanocytic nevi, unspecified: Secondary | ICD-10-CM | POA: Diagnosis not present

## 2018-04-10 DIAGNOSIS — L989 Disorder of the skin and subcutaneous tissue, unspecified: Secondary | ICD-10-CM | POA: Diagnosis not present

## 2018-04-20 ENCOUNTER — Ambulatory Visit
Admission: RE | Admit: 2018-04-20 | Discharge: 2018-04-20 | Disposition: A | Discharge status: Home / Self Care | Payer: BLUE CROSS/BLUE SHIELD | Source: Ambulatory Visit | Attending: Endocrinology | Admitting: Endocrinology

## 2018-04-20 DIAGNOSIS — C73 Malignant neoplasm of thyroid gland: Secondary | ICD-10-CM

## 2018-04-21 ENCOUNTER — Other Ambulatory Visit: Payer: BLUE CROSS/BLUE SHIELD

## 2018-06-08 DIAGNOSIS — D489 Neoplasm of uncertain behavior, unspecified: Secondary | ICD-10-CM | POA: Diagnosis not present

## 2018-06-08 DIAGNOSIS — D229 Melanocytic nevi, unspecified: Secondary | ICD-10-CM | POA: Diagnosis not present

## 2018-06-08 DIAGNOSIS — I781 Nevus, non-neoplastic: Secondary | ICD-10-CM | POA: Diagnosis not present

## 2018-06-08 DIAGNOSIS — L814 Other melanin hyperpigmentation: Secondary | ICD-10-CM | POA: Diagnosis not present

## 2018-06-09 DIAGNOSIS — M25562 Pain in left knee: Secondary | ICD-10-CM | POA: Diagnosis not present

## 2018-07-28 DIAGNOSIS — D489 Neoplasm of uncertain behavior, unspecified: Secondary | ICD-10-CM | POA: Diagnosis not present

## 2018-07-30 DIAGNOSIS — E890 Postprocedural hypothyroidism: Secondary | ICD-10-CM | POA: Diagnosis not present

## 2018-07-30 DIAGNOSIS — F900 Attention-deficit hyperactivity disorder, predominantly inattentive type: Secondary | ICD-10-CM | POA: Diagnosis not present

## 2018-07-30 DIAGNOSIS — C73 Malignant neoplasm of thyroid gland: Secondary | ICD-10-CM | POA: Diagnosis not present

## 2018-07-30 DIAGNOSIS — L819 Disorder of pigmentation, unspecified: Secondary | ICD-10-CM | POA: Diagnosis not present

## 2018-07-30 DIAGNOSIS — L680 Hirsutism: Secondary | ICD-10-CM | POA: Diagnosis not present

## 2018-09-14 DIAGNOSIS — E890 Postprocedural hypothyroidism: Secondary | ICD-10-CM | POA: Diagnosis not present

## 2018-09-14 DIAGNOSIS — Z0000 Encounter for general adult medical examination without abnormal findings: Secondary | ICD-10-CM | POA: Diagnosis not present

## 2018-09-14 DIAGNOSIS — L680 Hirsutism: Secondary | ICD-10-CM | POA: Diagnosis not present

## 2018-09-17 ENCOUNTER — Ambulatory Visit: Payer: BLUE CROSS/BLUE SHIELD | Admitting: Internal Medicine

## 2018-09-21 DIAGNOSIS — G43909 Migraine, unspecified, not intractable, without status migrainosus: Secondary | ICD-10-CM | POA: Diagnosis not present

## 2018-09-21 DIAGNOSIS — Z85850 Personal history of malignant neoplasm of thyroid: Secondary | ICD-10-CM | POA: Diagnosis not present

## 2018-09-21 DIAGNOSIS — Z1331 Encounter for screening for depression: Secondary | ICD-10-CM | POA: Diagnosis not present

## 2018-09-21 DIAGNOSIS — E039 Hypothyroidism, unspecified: Secondary | ICD-10-CM | POA: Diagnosis not present

## 2018-09-21 DIAGNOSIS — Z0000 Encounter for general adult medical examination without abnormal findings: Secondary | ICD-10-CM | POA: Diagnosis not present

## 2018-09-21 DIAGNOSIS — L209 Atopic dermatitis, unspecified: Secondary | ICD-10-CM | POA: Diagnosis not present

## 2018-09-23 ENCOUNTER — Encounter: Payer: Self-pay | Admitting: Internal Medicine

## 2018-09-23 NOTE — Progress Notes (Unsigned)
Received labs from PCP, from 09/21/2018: TSH 0.0 (0.4-4.2), free T4 1.1, total T3 213 (70-170)  Per review of the chart, she appears to be seeing Dr. Forde Dandy now for her thyroid condition.  She canceled our appointment from this month.

## 2018-10-21 DIAGNOSIS — Z1151 Encounter for screening for human papillomavirus (HPV): Secondary | ICD-10-CM | POA: Diagnosis not present

## 2018-10-21 DIAGNOSIS — Z01419 Encounter for gynecological examination (general) (routine) without abnormal findings: Secondary | ICD-10-CM | POA: Diagnosis not present

## 2018-10-21 DIAGNOSIS — Z1231 Encounter for screening mammogram for malignant neoplasm of breast: Secondary | ICD-10-CM | POA: Diagnosis not present

## 2018-10-21 DIAGNOSIS — Z124 Encounter for screening for malignant neoplasm of cervix: Secondary | ICD-10-CM | POA: Diagnosis not present

## 2018-10-28 DIAGNOSIS — Z03818 Encounter for observation for suspected exposure to other biological agents ruled out: Secondary | ICD-10-CM | POA: Diagnosis not present

## 2019-01-17 DIAGNOSIS — R768 Other specified abnormal immunological findings in serum: Secondary | ICD-10-CM | POA: Diagnosis not present

## 2019-01-17 DIAGNOSIS — E2740 Unspecified adrenocortical insufficiency: Secondary | ICD-10-CM | POA: Diagnosis not present

## 2019-01-17 DIAGNOSIS — N943 Premenstrual tension syndrome: Secondary | ICD-10-CM | POA: Diagnosis not present

## 2019-01-17 DIAGNOSIS — N926 Irregular menstruation, unspecified: Secondary | ICD-10-CM | POA: Diagnosis not present

## 2019-01-18 ENCOUNTER — Other Ambulatory Visit: Payer: Self-pay

## 2019-01-18 ENCOUNTER — Encounter: Payer: Self-pay | Admitting: Internal Medicine

## 2019-01-18 ENCOUNTER — Ambulatory Visit (INDEPENDENT_AMBULATORY_CARE_PROVIDER_SITE_OTHER): Payer: BLUE CROSS/BLUE SHIELD | Admitting: Internal Medicine

## 2019-01-18 VITALS — BP 120/70 | HR 88 | Ht 68.6 in | Wt 193.0 lb

## 2019-01-18 DIAGNOSIS — E890 Postprocedural hypothyroidism: Secondary | ICD-10-CM

## 2019-01-18 DIAGNOSIS — C73 Malignant neoplasm of thyroid gland: Secondary | ICD-10-CM | POA: Diagnosis not present

## 2019-01-18 LAB — T3, FREE: T3, Free: 4.6 pg/mL — ABNORMAL HIGH (ref 2.3–4.2)

## 2019-01-18 LAB — TSH: TSH: 0.17 u[IU]/mL — ABNORMAL LOW (ref 0.35–4.50)

## 2019-01-18 LAB — T4, FREE: Free T4: 0.55 ng/dL — ABNORMAL LOW (ref 0.60–1.60)

## 2019-01-18 NOTE — Progress Notes (Signed)
Patient ID: Sheila Curtis, female   DOB: 07-Sep-1970, 48 y.o.   MRN: [id number]   HPI  Sheila Curtis is a 48 y.o.-year-old female, returning for f/u for metastatic papillary thyroid cancer  and postsurgical hypothyroidism. Last visit 03/2018. She also sees Dr. Mylinda Latina with Integrative medicine in [facility].  Reviewed her cancer history: Pt. has been dx with metastatic papillary thyroid cancer in 2009. 08/18/2007: Total thyroidectomy (Dr Harlow Asa) Pathology: 1. THYROID, RIGHT LOBE, HEMITHYROIDECTOMY: - PAPILLARY THYROID CARCINOMA. (1.8 CM IN GREATEST DIMENSION). - CHRONIC THYROIDITIS. - ONE BENIGN LYMPH NODE (ISTHMUS). - ONE BENIGN PARATHYROID GLAND IDENTIFIED. - INKED MARGINS NEGATIVE FOR TUMOR.  2. LYMPH NODE, CENTRAL COMPARTMENT, RESECTION: - 3 OF 7 LYMPH NODES WITH METASTATIC PAPILLARY THYROID CARCINOMA.  3. THYROID, LEFT, HEMITHYROIDECTOMY: - CHRONIC THYROIDITIS  09/21/2007: RAI treatment with 159 mCi I-131 09/28/2007: Whole body scan negative, except expected uptake in the thyroidectomy bed 02/09/2009: Whole body scan negative 04/04/2011: Whole body scan negative 06/11/2012: Whole body scan negative She developed posterior L neck pain >> a new whole-body scan was obtained on 05/04/2015 >> negative Thyroid ultrasound (04/20/2018)-ordered by Dr. Forde Dandy: No abnormal masses  Reviewed her thyroglobulin and ATA levels: 01/2014: Tg 2.1, ATA 6.0 (0-0.9), TSH 0.060 08/18/2014: Tg <2.0 (RIA), ATA 6.7, TSH 0.060 03/29/2015: Tg <2.0 (RIA), ATA 8.9, TSH 0.230 05/04/2015: ATA 11.0, TSH 13.11 (*patient believes that this was not correct) Component     Latest Ref Rng 06/08/2015  Thyroglobulin Ab     <2 IU/mL 11 (H)  Free T4     0.60 - 1.60 ng/dL 0.54 (L)  TSH     0.35 - 4.50 uIU/mL 0.15 (L)  T3, Free     2.3 - 4.2 pg/mL 3.5  Thyroglobulin     2.8 - 40.9 ng/mL <0.1 (L)   09/09/2015: ATA 22.8 03/16/2018: Tg (RIA) 2.7, ATA 9.1 Component     Latest Ref  Rng & Units 03/23/2018  Thyroglobulin Antibody     0.0 - 0.9 IU/mL 10.2 (H)  Thyroglobulin by LCMS     1.5 - 38.5 ng/mL <0.2 (L)   She developed postop hypothyroidism after her total thyroidectomy in 2009; (tried Synthroid and did not help) -continuing on Armour 120 mg daily. She was also on levothyroxine 50 mcg daily (added by her integrative medicine provider), now off. She takes the Armour: - in am - fasting - at least 30 min from b'fast - no Ca, Fe, PPIs, MVI - not on Biotin  Reviewed her TFTs: Received labs from PCP, from 09/21/2018: TSH 0.0 (0.4-4.2), free T4 1.1, total T3 213 (70-170). Per review of the chart, she appears to be seeing Dr. Forde Dandy at that time her thyroid condition and canceled our appointment so I did not change her regimen 03/16/2018: TSH 0.84 (0.4-4.2), fT4 0.7 (0.8-1.8) Lab Results  Component Value Date   TSH 0.090 (L) 10/05/2015   TSH 0.57 07/06/2015   TSH 0.15 (L) 06/08/2015   TSH 3.73 07/02/2007   FREET4 0.83 10/05/2015   FREET4 0.65 07/06/2015   FREET4 0.54 (L) 06/08/2015   Pt denies: - feeling nodules in neck - hoarseness - dysphagia - choking - SOB with lying down  She has + FH of thyroid disorders in: mother. No FH of thyroid cancer. No h/o radiation tx to head or neck.  No seaweed or kelp. No recent contrast studies. No herbal supplements. No Biotin use. No recent steroids use.   She saw Dr. Tye Savoy in [place] -he started her on pregnenolone (?)  Before last visit and also added levothyroxine to her Armour dose (?).  ROS: Constitutional: + weight gain/no weight loss, + fatigue, + subjective hyperthermia, no subjective hypothermia Eyes: no blurry vision, no xerophthalmia ENT: no sore throat, + see HPI Cardiovascular: no CP/no SOB/no palpitations/no leg swelling Respiratory: no cough/no SOB/no wheezing Gastrointestinal: no N/no V/no D/no C/no acid reflux Musculoskeletal: no muscle aches/no joint aches Skin: no rashes, no hair  loss Neurological: no tremors/no numbness/no tingling/no dizziness  I reviewed pt's medications, allergies, PMH, social hx, family hx, and changes were documented in the history of present illness. Otherwise, unchanged from my initial visit note.  Past Medical History:  Diagnosis Date  . Migraine aura, persistent   . Thyroid cancer [facility])    Past Surgical History:  Procedure Laterality Date  . BREAST REDUCTION SURGERY  1992  . CESAREAN SECTION    . THYROIDECTOMY  2009   Social History   Social History  . Marital Status: Married    Spouse Name: N/A  . Number of Children: 1   Occupational History  .  self-employed, Optometrist    Social History Main Topics  . Smoking status:  former smoker, quit in 1998   . Smokeless tobacco: Not on file  . Alcohol Use: Yes     Comment: ocasionnaly, vault,, wine   . Drug Use: No  . Sexual Activity: Yes    Birth Control/ Protection: Condom   Current Outpatient Medications on File Prior to Visit  Medication Sig Dispense Refill  . AFLURIA PRESERVATIVE FREE 0.5 ML SUSY inject 0.5 milliliter intramuscularly  0  . HYDROcodone-acetaminophen (NORCO/VICODIN) 5-325 MG per tablet Take 2 tablets by mouth every 4 (four) hours as needed. 40 tablet 0  . naproxen sodium (ANAPROX DS) 550 MG tablet Take 1 tablet (550 mg total) by mouth 2 (two) times daily with a meal. 60 tablet 2  . nitrofurantoin (MACRODANTIN) 100 MG capsule Take 100 mg by mouth 2 (two) times daily.    . ondansetron (ZOFRAN-ODT) 4 MG disintegrating tablet Take 1 tablet (4 mg total) by mouth every 8 (eight) hours as needed. 20 tablet 3  . promethazine (PHENERGAN) 25 MG tablet Take 1 tablet (25 mg total) by mouth every 6 (six) hours as needed. 30 tablet 2  . sulfamethoxazole-trimethoprim (BACTRIM DS,SEPTRA DS) 800-160 MG tablet Take 1 tablet by mouth 2 (two) times daily.    . SUMAtriptan (IMITREX) 100 MG tablet Take 100 mg by mouth as needed.   1  . SUMAtriptan-naproxen (TREXIMET) 85-500 MG per  tablet Take 1 tablet by mouth every 2 (two) hours as needed for migraine. 9 tablet 11  . thyroid (ARMOUR) 60 MG tablet Take 2 tablets (120 mg total) by mouth daily before breakfast. 180 tablet 3  . topiramate (TOPAMAX) 200 MG tablet Take 1 tablet (200 mg total) by mouth daily. 30 tablet 11  . triamcinolone cream (KENALOG) 0.1 % Apply 1 application topically as needed.   0  . VYVANSE 70 MG capsule Take 70 mg by mouth daily.   0   No current facility-administered medications on file prior to visit.    No Known Allergies Family History  Problem Relation Age of Onset  . Hypertension Mother   . Diabetes Maternal Grandmother   . Cancer Maternal Grandmother        esopagus   PE: BP 120/70   Pulse 88   Ht 5' 8.6" (1.742 m) Comment: measured without shoes  Wt 193 lb (87.5 kg)   BMI  28.83 kg/m  Body mass index is 28.83 kg/m. Wt Readings from Last 3 Encounters:  01/18/19 193 lb (87.5 kg)  03/23/18 187 lb (84.8 kg)  02/16/17 178 lb (80.7 kg)   Constitutional: overweight, in NAD Eyes: PERRLA, EOMI, no exophthalmos ENT: moist mucous membranes, no neck masses palpable, no cervical lymphadenopathy Cardiovascular: RRR, No MRG Respiratory: CTA B Gastrointestinal: abdomen soft, NT, ND, BS+ Musculoskeletal: no deformities, strength intact in all 4 Skin: moist, warm, no rashes Neurological: no tremor with outstretched hands, DTR normal in all 4  ASSESSMENT: 1. Papillary thyroid cancer  2. Hypothyroidism  PLAN:  1. Papillary thyroid cancer, metastatic to lymph nodes -Patient with history of thyroidectomy by Dr. Harlow Asa in 2009, followed by RAI treatment (159 mCi) in 09/2017.  Afterwards, she had 5 whole-body scans that were negative for recurrences, with last being in 04/2015.  Her thyroglobulin levels were elevated until 01/2014 (when her Tg was 2.1), but became undetectable by the RAI assay from [facility] afterwards.  We are following her with this assay because she has positive ATA  antibodies. -She was lost to follow-up for 2.5 years before last visit. -After our last visit, she saw Dr. Forde Dandy x1 for a second opinion, but now returns to see me. He ordered a neck ultrasound 04/2018 which did not show any abnormalities. -At today's visit, we will check a thyroglobulin by the [facility] assay I will check a whole-body scan if increasing.  However, we have to be mindful of price, she usually has a high co-pay for the whole body scan.    2. Patient with postop hypothyroidism, which is uncontrolled - latest thyroid labs reviewed with pt >> TSH was suppressed 09/2018 but I did not change her Armour dose then as I thought that she transferred care to Dr. Forde Dandy... At this visit, we discussed that we will most likely need to reduce the Armour dose after results are back, if TSH still suppressed. - she continues on Armour 120 mg daily - pt feels good on this dose but continues to have fatigue and weight gain. - we discussed about taking the thyroid hormone every day, with water, >30 minutes before breakfast, separated by >4 hours from acid reflux medications, calcium, iron, multivitamins. Pt. is taking it correctly. - will check thyroid tests today: TSH, free T3 and fT4 - If labs are abnormal, she will need to return for repeat TFTs in 1.5 months - RTC in 1 year with labs in 6 months  Needs refills.  Component     Latest Ref Rng & Units 01/18/2019  T4,Free(Direct)     0.60 - 1.60 ng/dL 0.55 (L)  TSH     0.35 - 4.50 uIU/mL 0.17 (L)  Triiodothyronine,Free,Serum     2.3 - 4.2 pg/mL 4.6 (H)   TSH suppressed.  We will need to decrease her Armour dose alternating with 120 mg every other day and recheck her test in 1 month  Component     Latest Ref Rng & Units 01/18/2019  Thyroglobulin Antibody     0.0 - 0.9 IU/mL 11.6 (H)  Thyroglobulin by LCMS     1.5 - 38.5 ng/mL <0.2 (L)   Thyroglobulin is undetectable while thyroglobulin antibodies are stable, elevated.  Philemon Kingdom, MD  PhD [facility]

## 2019-01-18 NOTE — Patient Instructions (Addendum)
Please stop at the lab.  Please continue: - Armour 120 mg daily  Take the thyroid hormone every day, with water, at least 30 minutes before breakfast, separated by at least 4 hours from: - acid reflux medications - calcium - iron - multivitamins  Please come back for a follow-up appointment in 1 year, but labs in 6 months.

## 2019-01-19 MED ORDER — THYROID 60 MG PO TABS: ORAL_TABLET | ORAL | 3 refills | Status: DC

## 2019-01-21 LAB — THYROGLOBULIN BY LCMS: Thyroglobulin by LCMS: <0.2 ng/mL — ABNORMAL LOW (ref 1.5–38.5)

## 2019-01-21 LAB — TGAB+THYROGLOBULIN IMA OR LCMS: Thyroglobulin Antibody: 11.6 IU/mL — ABNORMAL HIGH (ref 0.0–0.9)

## 2019-01-24 ENCOUNTER — Encounter: Payer: Self-pay | Admitting: Internal Medicine

## 2019-01-27 DIAGNOSIS — R5383 Other fatigue: Secondary | ICD-10-CM | POA: Diagnosis not present

## 2019-01-27 DIAGNOSIS — E063 Autoimmune thyroiditis: Secondary | ICD-10-CM | POA: Diagnosis not present

## 2019-01-27 DIAGNOSIS — N951 Menopausal and female climacteric states: Secondary | ICD-10-CM | POA: Diagnosis not present

## 2019-01-27 DIAGNOSIS — E663 Overweight: Secondary | ICD-10-CM | POA: Diagnosis not present

## 2019-01-27 DIAGNOSIS — R5381 Other malaise: Secondary | ICD-10-CM | POA: Diagnosis not present

## 2019-01-27 DIAGNOSIS — E039 Hypothyroidism, unspecified: Secondary | ICD-10-CM | POA: Diagnosis not present

## 2019-01-27 DIAGNOSIS — E559 Vitamin D deficiency, unspecified: Secondary | ICD-10-CM | POA: Diagnosis not present

## 2019-03-03 DIAGNOSIS — Z23 Encounter for immunization: Secondary | ICD-10-CM | POA: Diagnosis not present

## 2019-03-24 DIAGNOSIS — F900 Attention-deficit hyperactivity disorder, predominantly inattentive type: Secondary | ICD-10-CM | POA: Diagnosis not present

## 2019-03-24 DIAGNOSIS — E890 Postprocedural hypothyroidism: Secondary | ICD-10-CM | POA: Diagnosis not present

## 2019-03-24 DIAGNOSIS — Z85850 Personal history of malignant neoplasm of thyroid: Secondary | ICD-10-CM | POA: Diagnosis not present

## 2019-03-24 DIAGNOSIS — G43909 Migraine, unspecified, not intractable, without status migrainosus: Secondary | ICD-10-CM | POA: Diagnosis not present

## 2019-04-27 DIAGNOSIS — N943 Premenstrual tension syndrome: Secondary | ICD-10-CM | POA: Diagnosis not present

## 2019-04-27 DIAGNOSIS — R6882 Decreased libido: Secondary | ICD-10-CM | POA: Diagnosis not present

## 2019-04-27 DIAGNOSIS — E039 Hypothyroidism, unspecified: Secondary | ICD-10-CM | POA: Diagnosis not present

## 2019-04-27 DIAGNOSIS — N951 Menopausal and female climacteric states: Secondary | ICD-10-CM | POA: Diagnosis not present

## 2019-04-27 DIAGNOSIS — E559 Vitamin D deficiency, unspecified: Secondary | ICD-10-CM | POA: Diagnosis not present

## 2019-05-03 DIAGNOSIS — E039 Hypothyroidism, unspecified: Secondary | ICD-10-CM | POA: Diagnosis not present

## 2019-05-03 DIAGNOSIS — R5381 Other malaise: Secondary | ICD-10-CM | POA: Diagnosis not present

## 2019-05-03 DIAGNOSIS — E663 Overweight: Secondary | ICD-10-CM | POA: Diagnosis not present

## 2019-05-03 DIAGNOSIS — R5383 Other fatigue: Secondary | ICD-10-CM | POA: Diagnosis not present

## 2019-05-25 ENCOUNTER — Ambulatory Visit: Payer: BLUE CROSS/BLUE SHIELD | Attending: Internal Medicine

## 2019-05-25 DIAGNOSIS — Z20822 Contact with and (suspected) exposure to covid-19: Secondary | ICD-10-CM

## 2019-05-27 LAB — NOVEL CORONAVIRUS, NAA: SARS-CoV-2, NAA: NOT DETECTED

## 2019-05-27 LAB — SPECIMEN STATUS REPORT

## 2019-07-18 ENCOUNTER — Other Ambulatory Visit: Payer: Self-pay

## 2019-07-18 ENCOUNTER — Other Ambulatory Visit (INDEPENDENT_AMBULATORY_CARE_PROVIDER_SITE_OTHER): Payer: BLUE CROSS/BLUE SHIELD

## 2019-07-18 DIAGNOSIS — E890 Postprocedural hypothyroidism: Secondary | ICD-10-CM

## 2019-07-18 LAB — T3, FREE: T3, Free: 4.5 pg/mL — ABNORMAL HIGH (ref 2.3–4.2)

## 2019-07-18 LAB — TSH: TSH: 0.01 u[IU]/mL — ABNORMAL LOW (ref 0.35–4.50)

## 2019-07-18 LAB — T4, FREE: Free T4: 1.11 ng/dL (ref 0.60–1.60)

## 2019-07-25 DIAGNOSIS — Z23 Encounter for immunization: Secondary | ICD-10-CM | POA: Diagnosis not present

## 2019-08-08 DIAGNOSIS — Z9889 Other specified postprocedural states: Secondary | ICD-10-CM | POA: Diagnosis not present

## 2019-08-22 DIAGNOSIS — Z23 Encounter for immunization: Secondary | ICD-10-CM | POA: Diagnosis not present

## 2019-09-19 DIAGNOSIS — Z0000 Encounter for general adult medical examination without abnormal findings: Secondary | ICD-10-CM | POA: Diagnosis not present

## 2019-09-19 DIAGNOSIS — E038 Other specified hypothyroidism: Secondary | ICD-10-CM | POA: Diagnosis not present

## 2019-09-26 DIAGNOSIS — E890 Postprocedural hypothyroidism: Secondary | ICD-10-CM | POA: Diagnosis not present

## 2019-09-26 DIAGNOSIS — G43909 Migraine, unspecified, not intractable, without status migrainosus: Secondary | ICD-10-CM | POA: Diagnosis not present

## 2019-09-26 DIAGNOSIS — Z85850 Personal history of malignant neoplasm of thyroid: Secondary | ICD-10-CM | POA: Diagnosis not present

## 2019-09-26 DIAGNOSIS — F900 Attention-deficit hyperactivity disorder, predominantly inattentive type: Secondary | ICD-10-CM | POA: Diagnosis not present

## 2019-09-26 DIAGNOSIS — Z1331 Encounter for screening for depression: Secondary | ICD-10-CM | POA: Diagnosis not present

## 2019-10-31 DIAGNOSIS — M25562 Pain in left knee: Secondary | ICD-10-CM | POA: Diagnosis not present

## 2019-11-15 DIAGNOSIS — M25562 Pain in left knee: Secondary | ICD-10-CM | POA: Diagnosis not present

## 2019-11-18 DIAGNOSIS — M25562 Pain in left knee: Secondary | ICD-10-CM | POA: Diagnosis not present

## 2019-11-18 DIAGNOSIS — M1712 Unilateral primary osteoarthritis, left knee: Secondary | ICD-10-CM | POA: Diagnosis not present

## 2019-12-26 DIAGNOSIS — Z01419 Encounter for gynecological examination (general) (routine) without abnormal findings: Secondary | ICD-10-CM | POA: Diagnosis not present

## 2019-12-26 DIAGNOSIS — Z6825 Body mass index (BMI) 25.0-25.9, adult: Secondary | ICD-10-CM | POA: Diagnosis not present

## 2019-12-26 DIAGNOSIS — Z1231 Encounter for screening mammogram for malignant neoplasm of breast: Secondary | ICD-10-CM | POA: Diagnosis not present

## 2020-01-18 ENCOUNTER — Other Ambulatory Visit: Payer: Self-pay

## 2020-01-18 ENCOUNTER — Ambulatory Visit (INDEPENDENT_AMBULATORY_CARE_PROVIDER_SITE_OTHER): Payer: BC Managed Care – PPO | Admitting: Internal Medicine

## 2020-01-18 ENCOUNTER — Encounter: Payer: Self-pay | Admitting: Internal Medicine

## 2020-01-18 VITALS — BP 118/70 | HR 83 | Ht 68.6 in | Wt 168.0 lb

## 2020-01-18 DIAGNOSIS — C73 Malignant neoplasm of thyroid gland: Secondary | ICD-10-CM

## 2020-01-18 DIAGNOSIS — E890 Postprocedural hypothyroidism: Secondary | ICD-10-CM

## 2020-01-18 LAB — T3, FREE: T3, Free: 4 pg/mL (ref 2.3–4.2)

## 2020-01-18 LAB — TSH: TSH: 0.11 u[IU]/mL — ABNORMAL LOW (ref 0.35–4.50)

## 2020-01-18 LAB — T4, FREE: Free T4: 0.63 ng/dL (ref 0.60–1.60)

## 2020-01-18 NOTE — Progress Notes (Addendum)
Patient ID: Sheila Curtis, female   DOB: 09-23-1970, 49 y.o.   MRN: [id number]  This visit occurred during the SARS-CoV-2 public health emergency.  Safety protocols were in place, including screening questions prior to the visit, additional usage of staff PPE, and extensive cleaning of exam room while observing appropriate contact time as indicated for disinfecting solutions.   HPI  Sheila Curtis is a 49 y.o.-year-old female, returning for f/u for metastatic papillary thyroid cancer  and postsurgical hypothyroidism. Last visit 1 year ago. She also sees Dr. Mylinda Latina with Integrative medicine in [facility].  Reviewed her cancer history: Pt. has been dx with metastatic papillary thyroid cancer in 2009: 08/18/2007: Total thyroidectomy (Dr Harlow Asa) Pathology: 1. THYROID, RIGHT LOBE, HEMITHYROIDECTOMY: - PAPILLARY THYROID CARCINOMA. (1.8 CM IN GREATEST DIMENSION). - CHRONIC THYROIDITIS. - ONE BENIGN LYMPH NODE (ISTHMUS). - ONE BENIGN PARATHYROID GLAND IDENTIFIED. - INKED MARGINS NEGATIVE FOR TUMOR.  2. LYMPH NODE, CENTRAL COMPARTMENT, RESECTION: - 3 OF 7 LYMPH NODES WITH METASTATIC PAPILLARY THYROID CARCINOMA.  3. THYROID, LEFT, HEMITHYROIDECTOMY: - CHRONIC THYROIDITIS  09/21/2007: RAI treatment with 159 mCi I-131 09/28/2007: Whole body scan negative, except expected uptake in the thyroidectomy bed 02/09/2009: Whole body scan negative 04/04/2011: Whole body scan negative 06/11/2012: Whole body scan negative She developed posterior L neck pain >> a new whole-body scan was obtained on 05/04/2015 >> negative Thyroid ultrasound (04/20/2018)-ordered by Dr. Forde Dandy: No abnormal masses  Reviewed her thyroglobulin and ATA levels: 01/2014: Tg 2.1, ATA 6.0 (0-0.9), TSH 0.060 08/18/2014: Tg <2.0 (RIA), ATA 6.7, TSH 0.060 03/29/2015: Tg <2.0 (RIA), ATA 8.9, TSH 0.230 05/04/2015: ATA 11.0, TSH 13.11 (*patient believes that this was not correct) Component     Latest Ref  Rng 06/08/2015  Thyroglobulin Ab     <2 IU/mL 11 (H)  Free T4     0.60 - 1.60 ng/dL 0.54 (L)  TSH     0.35 - 4.50 uIU/mL 0.15 (L)  T3, Free     2.3 - 4.2 pg/mL 3.5  Thyroglobulin     2.8 - 40.9 ng/mL <0.1 (L)   09/09/2015: ATA 22.8 03/16/2018: Tg (RIA) 2.7, ATA 9.1 Component     Latest Ref Rng & Units 03/23/2018  Thyroglobulin Antibody     0.0 - 0.9 IU/mL 10.2 (H)  Thyroglobulin by LCMS     1.5 - 38.5 ng/mL <0.2 (L)   Component     Latest Ref Rng & Units 01/18/2019  Thyroglobulin Antibody     0.0 - 0.9 IU/mL 11.6 (H)  Thyroglobulin by LCMS     1.5 - 38.5 ng/mL <0.2 (L)   She developed postop hypothyroidism after her total thyroidectomy in 2009; (tried Synthroid and did not help) -continuing on Armour 120 mg daily. She was also on levothyroxine 50 mcg daily (added by her integrative medicine provider) but came off before last visit.  At that time her TSH was suppressed and we decreased the dose of Armour to 90 alternating with 120 mg every other day.  TSH was still suppressed in 07/2019.  At that time, I sent her a message about decreasing the Armour further, to 90 mg daily, however, she forgot to do so and she continues on the alternating dose...  She takes this: - in am - fasting - at least 30 min from b'fast - no Ca, Fe, MVI, PPIs - not on Biotin  Reviewed her TFTs: Lab Results  Component Value Date   TSH 0.01 (L) 07/18/2019   TSH 0.17 (L) 01/18/2019  TSH 0.090 (L) 10/05/2015   TSH 0.57 07/06/2015   TSH 0.15 (L) 06/08/2015   TSH 3.73 07/02/2007   FREET4 1.11 07/18/2019   FREET4 0.55 (L) 01/18/2019   FREET4 0.83 10/05/2015   FREET4 0.65 07/06/2015   FREET4 0.54 (L) 06/08/2015  Received labs from PCP, from 09/21/2018: TSH 0.0 (0.4-4.2), free T4 1.1, total T3 213 (70-170). Per review of the chart, she appears to be seeing Dr. Forde Dandy at that time her thyroid condition and canceled our appointment so I did not change her regimen 03/16/2018: TSH 0.84 (0.4-4.2), fT4 0.7  (0.8-1.8)  Pt denies: - feeling nodules in neck - hoarseness - dysphagia - choking - SOB with lying down  She has + FH of thyroid disorders in: mother. No FH of thyroid cancer. No h/o radiation tx to head or neck.  No seaweed or kelp. No recent contrast studies. No herbal supplements. No Biotin use. No recent steroids use.   She saw Dr. Tye Savoy in [place] -he started her on pregnenolone (?)  Before last visit and also added levothyroxine to her Armour dose (?).  ROS: Constitutional: no weight gain/+ weight loss, no fatigue, + hot flushes, no subjective hypothermia Eyes: no blurry vision, no xerophthalmia ENT: no sore throat, + see HPI Cardiovascular: no CP/no SOB/no palpitations/no leg swelling Respiratory: no cough/no SOB/no wheezing Gastrointestinal: no N/no V/no D/no C/no acid reflux Musculoskeletal: no muscle aches/no joint aches Skin: no rashes, no hair loss Neurological: no tremors/no numbness/no tingling/no dizziness + Skipped 3 mo of menses earlier in the year  I reviewed pt's medications, allergies, PMH, social hx, family hx, and changes were documented in the history of present illness. Otherwise, unchanged from my initial visit note.   Past Medical History:  Diagnosis Date  . Migraine aura, persistent   . Thyroid cancer [facility])    Past Surgical History:  Procedure Laterality Date  . BREAST REDUCTION SURGERY  1992  . CESAREAN SECTION    . THYROIDECTOMY  2009   Social History   Social History  . Marital Status: Married    Spouse Name: N/A  . Number of Children: 1   Occupational History  .  self-employed, Optometrist    Social History Main Topics  . Smoking status:  former smoker, quit in 1998   . Smokeless tobacco: Not on file  . Alcohol Use: Yes     Comment: ocasionnaly, vault,, wine   . Drug Use: No  . Sexual Activity: Yes    Birth Control/ Protection: Condom   Current Outpatient Medications on File Prior to Visit  Medication Sig Dispense  Refill  . EMGALITY 120 MG/ML SOAJ     . naproxen sodium (ANAPROX DS) 550 MG tablet Take 1 tablet (550 mg total) by mouth 2 (two) times daily with a meal. 60 tablet 2  . ondansetron (ZOFRAN-ODT) 4 MG disintegrating tablet Take 1 tablet (4 mg total) by mouth every 8 (eight) hours as needed. 20 tablet 3  . promethazine (PHENERGAN) 25 MG tablet Take 1 tablet (25 mg total) by mouth every 6 (six) hours as needed. 30 tablet 2  . thyroid (ARMOUR) 60 MG tablet Alternate 1.5 with 2 tablets every other day 90 tablet 3  . VYVANSE 70 MG capsule Take 70 mg by mouth daily.   0   No current facility-administered medications on file prior to visit.   No Known Allergies Family History  Problem Relation Age of Onset  . Hypertension Mother   . Diabetes Maternal Grandmother   .  Cancer Maternal Grandmother        esopagus   PE: BP 118/70   Pulse 83   Ht 5' 8.6" (1.742 m)   Wt 168 lb (76.2 kg)   SpO2 99%   BMI 25.10 kg/m  Body mass index is 25.1 kg/m. Wt Readings from Last 3 Encounters:  01/18/20 168 lb (76.2 kg)  01/18/19 193 lb (87.5 kg)  03/23/18 187 lb (84.8 kg)   Constitutional: normal weight, in NAD Eyes: PERRLA, EOMI, no exophthalmos ENT: moist mucous membranes, no thyromegaly, no cervical lymphadenopathy Cardiovascular: RRR, No MRG Respiratory: CTA B Gastrointestinal: abdomen soft, NT, ND, BS+ Musculoskeletal: no deformities, strength intact in all 4 Skin: moist, warm, no rashes Neurological: no tremor with outstretched hands, DTR normal in all 4  ASSESSMENT: 1. Papillary thyroid cancer  2. Hypothyroidism  PLAN:  1. Papillary thyroid cancer, metastatic to lymph nodes - Patient with history of Patient with history of thyroidectomy by Dr. Harlow Asa in 2019, followed by RAI treatment (159 mCi) in 09/2017. -Her thyroglobulin levels were elevated until 01/2014, but became undetectable by the RAI assay from [facility] afterwards.  We are following her with this assay because she has positive  ATA antibodies. -In the past, she had a second opinion with Dr. Forde Dandy and then returned to see me last year.  She had a neck ultrasound in 04/2018, which did not show any abnormalities. -She does not complain of any neck compression symptoms -We have to be mindful of further imaging since she had a high co-pay for the body scans -At last visit, thyroglobulin level was undetectable and ATA antibodies were positive.  We will repeat this today.    2. Patient with postop hypothyroidism -  thyroid labs reviewed with pt >> suppressed at last visit, after which I advised her to decrease the dose to 90 alt. with 120 mg qod. After this, TSH was still suppressed in 07/2019 >> I sent her a msg through My Chart to decrease the dose evern further, to 90 mg daily, but she forgot. She did not return for labs afterwards. - Latest TSH: Lab Results  Component Value Date   TSH 0.01 (L) 07/18/2019   - pt feels good on this dose but she c/o hot flashes and she lost a significant amount of weight (from 193 pounds in 01/2019 to 168 pounds today -intentional, after cutting down dietary fat) - we discussed about taking the thyroid hormone every day, with water, >30 minutes before breakfast, separated by >4 hours from acid reflux medications, calcium, iron, multivitamins. Pt. is taking it correctly. - will check thyroid tests today: TSH, free T3 and fT4 -we most likely will need to decrease the dose of Armour, especially in the setting of the significant weight loss of 25 pounds - If labs are abnormal, she will need to return for repeat TFTs in 1.5 months - RTC in 1 year but with labs in 6 months  Office Visit on 01/18/2020  Component Date Value Ref Range Status  . TSH 01/18/2020 0.11* 0.35 - 4.50 uIU/mL Final  . Free T4 01/18/2020 0.63  0.60 - 1.60 ng/dL Final   Comment: Specimens from patients who are undergoing biotin therapy and /or ingesting biotin supplements may contain high levels of biotin.  The higher biotin  concentration in these specimens interferes with this Free T4 assay.  Specimens that contain high levels  of biotin may cause false high results for this Free T4 assay.  Please interpret results in light of  the total clinical presentation of the patient.    . Thyroglobulin Antibody 01/18/2020 17.5* 0.0 - 0.9 IU/mL Final   Thyroglobulin Antibody measured by [employer]  . T3, Free 01/18/2020 4.0  2.3 - 4.2 pg/mL Final  . Thyroglobulin by [facility] 01/18/2020 <0.2* 1.5 - 38.5 ng/mL Final   Comment: This test was developed and its performance characteristics determined by [facility]. It has not been cleared or approved by the Food and Drug Administration. According to the [facility] of Clinical Biochemistry, the reference interval for Thyroglobulin (TG) should be related to euthyroid patients and not for patients who underwent thyroidectomy. TG reference intervals for these patients depend on the residual mass of the thyroid tissue left after surgery. Establishing a post-operative baseline is recommended. The assay limit of quantitation is 0.2 ng/mL    TSH still suppressed, as expected.  We will go ahead and decrease the Armour dose to 90 mg daily, as discussed.  We will recheck her TFTs in 1.5 months.  At that time, I will repeat her thyroglobulin and ATA antibodies since they are slightly higher than before.  Philemon Kingdom, MD PhD [facility] Endocrinology

## 2020-01-18 NOTE — Patient Instructions (Addendum)
Please stop at the lab.  Please continue: - Armour 90 mg alternating with 120 mg every other day  Take the thyroid hormone every day, with water, at least 30 minutes before breakfast, separated by at least 4 hours from: - acid reflux medications - calcium - iron - multivitamins  Please come back for a follow-up appointment in 1 year, but labs in 6 months.

## 2020-01-20 ENCOUNTER — Encounter: Payer: Self-pay | Admitting: Internal Medicine

## 2020-01-20 LAB — TGAB+THYROGLOBULIN IMA OR LCMS: Thyroglobulin Antibody: 17.5 IU/mL — ABNORMAL HIGH (ref 0.0–0.9)

## 2020-01-20 LAB — THYROGLOBULIN BY LCMS: Thyroglobulin by LCMS: <0.2 ng/mL — ABNORMAL LOW (ref 1.5–38.5)

## 2020-01-20 MED ORDER — ARMOUR THYROID 90 MG PO TABS: 90.0000 mg | ORAL_TABLET | Freq: Every day | ORAL | 5 refills | Status: DC

## 2020-01-24 MED ORDER — ARMOUR THYROID 90 MG PO TABS: 90.0000 mg | ORAL_TABLET | Freq: Every day | ORAL | 5 refills | Status: DC

## 2020-01-24 NOTE — Addendum Note (Signed)
Addended by: Philemon Kingdom on: 01/24/2020 04:59 PM   Modules accepted: Orders

## 2020-03-03 DIAGNOSIS — Z23 Encounter for immunization: Secondary | ICD-10-CM | POA: Diagnosis not present

## 2020-03-14 ENCOUNTER — Other Ambulatory Visit: Payer: Self-pay

## 2020-03-14 ENCOUNTER — Other Ambulatory Visit (INDEPENDENT_AMBULATORY_CARE_PROVIDER_SITE_OTHER): Payer: BC Managed Care – PPO

## 2020-03-14 ENCOUNTER — Encounter: Payer: Self-pay | Admitting: Internal Medicine

## 2020-03-14 DIAGNOSIS — E890 Postprocedural hypothyroidism: Secondary | ICD-10-CM

## 2020-03-14 DIAGNOSIS — C73 Malignant neoplasm of thyroid gland: Secondary | ICD-10-CM | POA: Diagnosis not present

## 2020-03-14 LAB — T3, FREE: T3, Free: 4 pg/mL (ref 2.3–4.2)

## 2020-03-14 LAB — T4, FREE: Free T4: 0.57 ng/dL — ABNORMAL LOW (ref 0.60–1.60)

## 2020-03-14 LAB — TSH: TSH: 0.98 u[IU]/mL (ref 0.35–4.50)

## 2020-03-14 NOTE — Telephone Encounter (Signed)
Ok to change to 90 days supply

## 2020-03-15 MED ORDER — ARMOUR THYROID 90 MG PO TABS
90.0000 mg | ORAL_TABLET | Freq: Every day | ORAL | 3 refills | Status: DC
Start: 2020-03-15 — End: 2021-01-18

## 2020-03-23 ENCOUNTER — Encounter: Payer: Self-pay | Admitting: Internal Medicine

## 2020-03-24 DIAGNOSIS — Z23 Encounter for immunization: Secondary | ICD-10-CM | POA: Diagnosis not present

## 2020-04-02 DIAGNOSIS — M130 Polyarthritis, unspecified: Secondary | ICD-10-CM | POA: Diagnosis not present

## 2020-04-03 LAB — THYROGLOBULIN BY LCMS: Thyroglobulin by LCMS: <0.2 ng/mL — ABNORMAL LOW (ref 1.5–38.5)

## 2020-04-03 LAB — TGAB+THYROGLOBULIN IMA OR LCMS: Thyroglobulin Antibody: 15.6 IU/mL — ABNORMAL HIGH (ref 0.0–0.9)

## 2020-05-23 DIAGNOSIS — Z20822 Contact with and (suspected) exposure to covid-19: Secondary | ICD-10-CM | POA: Diagnosis not present

## 2020-07-13 ENCOUNTER — Other Ambulatory Visit (INDEPENDENT_AMBULATORY_CARE_PROVIDER_SITE_OTHER): Payer: BC Managed Care – PPO

## 2020-07-13 ENCOUNTER — Encounter: Payer: Self-pay | Admitting: Internal Medicine

## 2020-07-13 ENCOUNTER — Other Ambulatory Visit: Payer: Self-pay

## 2020-07-13 ENCOUNTER — Other Ambulatory Visit: Payer: Self-pay | Admitting: Internal Medicine

## 2020-07-13 DIAGNOSIS — C73 Malignant neoplasm of thyroid gland: Secondary | ICD-10-CM | POA: Diagnosis not present

## 2020-07-13 DIAGNOSIS — E890 Postprocedural hypothyroidism: Secondary | ICD-10-CM

## 2020-07-13 LAB — T4, FREE: Free T4: 0.4 ng/dL — ABNORMAL LOW (ref 0.60–1.60)

## 2020-07-13 LAB — TSH: TSH: 6.37 u[IU]/mL — ABNORMAL HIGH (ref 0.35–4.50)

## 2020-07-13 LAB — T3, FREE: T3, Free: 2.7 pg/mL (ref 2.3–4.2)

## 2020-07-16 ENCOUNTER — Other Ambulatory Visit: Payer: Self-pay | Admitting: Internal Medicine

## 2020-07-16 DIAGNOSIS — E890 Postprocedural hypothyroidism: Secondary | ICD-10-CM

## 2020-07-17 ENCOUNTER — Other Ambulatory Visit: Payer: Self-pay | Admitting: Internal Medicine

## 2020-07-17 DIAGNOSIS — E890 Postprocedural hypothyroidism: Secondary | ICD-10-CM

## 2020-07-17 MED ORDER — ARMOUR THYROID 15 MG PO TABS: 15.0000 mg | ORAL_TABLET | Freq: Every day | ORAL | 3 refills | Status: DC

## 2020-07-18 ENCOUNTER — Other Ambulatory Visit: Payer: BC Managed Care – PPO

## 2020-07-18 DIAGNOSIS — H04123 Dry eye syndrome of bilateral lacrimal glands: Secondary | ICD-10-CM | POA: Diagnosis not present

## 2020-07-18 DIAGNOSIS — H40033 Anatomical narrow angle, bilateral: Secondary | ICD-10-CM | POA: Diagnosis not present

## 2020-07-23 LAB — THYROGLOBULIN BY LCMS: Thyroglobulin by LCMS: <0.2 ng/mL — ABNORMAL LOW (ref 1.5–38.5)

## 2020-07-23 LAB — TGAB+THYROGLOBULIN IMA OR LCMS: Thyroglobulin Antibody: 11.2 IU/mL — ABNORMAL HIGH (ref 0.0–0.9)

## 2020-08-08 DIAGNOSIS — R5383 Other fatigue: Secondary | ICD-10-CM | POA: Diagnosis not present

## 2020-08-08 DIAGNOSIS — E039 Hypothyroidism, unspecified: Secondary | ICD-10-CM | POA: Diagnosis not present

## 2020-08-08 DIAGNOSIS — R5381 Other malaise: Secondary | ICD-10-CM | POA: Diagnosis not present

## 2020-08-08 DIAGNOSIS — E559 Vitamin D deficiency, unspecified: Secondary | ICD-10-CM | POA: Diagnosis not present

## 2020-08-24 DIAGNOSIS — R5381 Other malaise: Secondary | ICD-10-CM | POA: Diagnosis not present

## 2020-08-24 DIAGNOSIS — E039 Hypothyroidism, unspecified: Secondary | ICD-10-CM | POA: Diagnosis not present

## 2020-08-24 DIAGNOSIS — R5383 Other fatigue: Secondary | ICD-10-CM | POA: Diagnosis not present

## 2020-08-24 DIAGNOSIS — E559 Vitamin D deficiency, unspecified: Secondary | ICD-10-CM | POA: Diagnosis not present

## 2020-09-11 DIAGNOSIS — Z9889 Other specified postprocedural states: Secondary | ICD-10-CM | POA: Diagnosis not present

## 2020-09-24 DIAGNOSIS — E559 Vitamin D deficiency, unspecified: Secondary | ICD-10-CM | POA: Diagnosis not present

## 2020-09-24 DIAGNOSIS — E039 Hypothyroidism, unspecified: Secondary | ICD-10-CM | POA: Diagnosis not present

## 2020-09-24 DIAGNOSIS — R5381 Other malaise: Secondary | ICD-10-CM | POA: Diagnosis not present

## 2020-09-24 DIAGNOSIS — R5383 Other fatigue: Secondary | ICD-10-CM | POA: Diagnosis not present

## 2021-01-17 ENCOUNTER — Encounter: Payer: Self-pay | Admitting: Internal Medicine

## 2021-01-17 ENCOUNTER — Other Ambulatory Visit: Payer: Self-pay

## 2021-01-17 ENCOUNTER — Ambulatory Visit (INDEPENDENT_AMBULATORY_CARE_PROVIDER_SITE_OTHER): Payer: Managed Care, Other (non HMO) | Admitting: Internal Medicine

## 2021-01-17 VITALS — BP 120/72 | HR 102 | Ht 68.0 in | Wt 174.4 lb

## 2021-01-17 DIAGNOSIS — E890 Postprocedural hypothyroidism: Secondary | ICD-10-CM | POA: Diagnosis not present

## 2021-01-17 DIAGNOSIS — C73 Malignant neoplasm of thyroid gland: Secondary | ICD-10-CM

## 2021-01-17 LAB — TSH: TSH: 0.03 u[IU]/mL — ABNORMAL LOW (ref 0.35–5.50)

## 2021-01-17 LAB — T4, FREE: Free T4: 0.83 ng/dL (ref 0.60–1.60)

## 2021-01-17 NOTE — Patient Instructions (Signed)
Please stop at the lab.  Please continue: - Armour 105 mg every day  Take the thyroid hormone every day, with water, at least 30 minutes before breakfast, separated by at least 4 hours from: - acid reflux medications - calcium - iron - multivitamins  Please come back for a follow-up appointment in 1 year, but labs in 6 months.

## 2021-01-17 NOTE — Progress Notes (Signed)
Patient ID: Sheila Curtis, female   DOB: 11/14/1970, 50 y.o.   MRN: [id number]  This visit occurred during the SARS-CoV-2 public health emergency.  Safety protocols were in place, including screening questions prior to the visit, additional usage of staff PPE, and extensive cleaning of exam room while observing appropriate contact time as indicated for disinfecting solutions.   HPI  Sheila Curtis is a 50 y.o.-year-old female, returning for f/u for metastatic papillary thyroid cancer  and postsurgical hypothyroidism. Last visit 1 year ago. She also sees Dr. Mylinda Latina with Integrative [facility] in [facility]  Interim history: Patient has no complaints at this visit except for easy weight fluctuation within 3 pounds she also continues to have hot flashes, which have improved since last visit, though. She recently changed her job and she has more stress.  Reviewed her cancer history: Pt. has been dx with metastatic papillary thyroid cancer in 2009: 08/18/2007: Total thyroidectomy (Dr Harlow Asa) Pathology:  1. THYROID, RIGHT LOBE, HEMITHYROIDECTOMY:  - PAPILLARY THYROID CARCINOMA. (1.8 CM IN GREATEST DIMENSION).  - CHRONIC THYROIDITIS.  - ONE BENIGN LYMPH NODE (ISTHMUS).  - ONE BENIGN PARATHYROID GLAND IDENTIFIED.  - INKED MARGINS NEGATIVE FOR TUMOR.   2. LYMPH NODE, CENTRAL COMPARTMENT, RESECTION:  - 3 OF 7 LYMPH NODES WITH METASTATIC PAPILLARY THYROID CARCINOMA.   3. THYROID, LEFT, HEMITHYROIDECTOMY:  - CHRONIC THYROIDITIS  09/21/2007: RAI treatment with 159 mCi I-131 09/28/2007: Whole body scan negative, except expected uptake in the thyroidectomy bed 02/09/2009: Whole body scan negative 04/04/2011: Whole body scan negative 06/11/2012: Whole body scan negative She developed posterior L neck pain >> a new whole-body scan was obtained on 05/04/2015 >> negative Thyroid ultrasound (04/20/2018)-ordered by Dr. Forde Dandy: No abnormal masses  Reviewed her thyroglobulin and ATA  levels: 01/2014: Tg 2.1, ATA 6.0 (0-0.9), TSH 0.060 08/18/2014: Tg <2.0 (RIA), ATA 6.7, TSH 0.060 03/29/2015: Tg <2.0 (RIA), ATA 8.9, TSH 0.230 05/04/2015: ATA 11.0, TSH 13.11 (*patient believes that this was not correct) Component     Latest Ref Rng 06/08/2015  Thyroglobulin Ab     <2 IU/mL 11 (H)  Free T4     0.60 - 1.60 ng/dL 0.54 (L)  TSH     0.35 - 4.50 uIU/mL 0.15 (L)  T3, Free     2.3 - 4.2 pg/mL 3.5  Thyroglobulin     2.8 - 40.9 ng/mL <0.1 (L)   09/09/2015: ATA 22.8 03/16/2018: Tg (RIA) 2.7, ATA 9.1 Component     Latest Ref Rng & Units 03/23/2018  Thyroglobulin Antibody     0.0 - 0.9 IU/mL 10.2 (H)  Thyroglobulin by LCMS     1.5 - 38.5 ng/mL <0.2 (L)   Component     Latest Ref Rng & Units 01/18/2020 03/14/2020 07/13/2020  Thyroglobulin Antibody     0.0 - 0.9 IU/mL 17.5 (H) 15.6 (H) 11.2 (H)  Thyroglobulin by LCMS     1.5 - 38.5 ng/mL <0.2 (L) <0.2 (L) <0.2 (L)   She developed postop hypothyroidism after her total thyroidectomy in 2009; (tried Synthroid and did not help) -continuing on Armour 120 mg daily. She was also on levothyroxine 50 mcg daily (added by her integrative medicine provider) but came off before last visit.  At that time her TSH was suppressed and we decreased the dose of Armour to 90 alternating with 120 mg every other day.  TSH was still suppressed in 07/2019.  At that time, I sent her a message about decreasing the Armour further, to 90 mg daily,  however, she forgot to do so and she was still alternating doses at our visit from 01/2020.  We decreased the dose of Armour at that time to 90 mg daily but the TSH returned elevated in 06/2020 so we increased the dose to 105 mg daily.  She feels good on this dose, without complaints.  She takes this: - in am - Coffee + stevia, occasionally creamer - >1h later - fasting - at least 30 min from b'fast - no Ca, Fe, MVI, PPIs - not on Biotin  Reviewed her TFTs: Lab Results  Component Value Date   TSH 6.37 (H)  07/13/2020   TSH 0.98 03/14/2020   TSH 0.11 (L) 01/18/2020   TSH 0.01 (L) 07/18/2019   TSH 0.17 (L) 01/18/2019   TSH 0.090 (L) 10/05/2015   TSH 0.57 07/06/2015   TSH 0.15 (L) 06/08/2015   TSH 3.73 07/02/2007   FREET4 0.40 (L) 07/13/2020   FREET4 0.57 (L) 03/14/2020   FREET4 0.63 01/18/2020   FREET4 1.11 07/18/2019   FREET4 0.55 (L) 01/18/2019   FREET4 0.83 10/05/2015   FREET4 0.65 07/06/2015   FREET4 0.54 (L) 06/08/2015  Received labs from PCP, from 09/21/2018: TSH 0.0 (0.4-4.2), free T4 1.1, total T3 213 (70-170). Per review of the chart, she appears to be seeing Dr. Forde Dandy at that time her thyroid condition and canceled our appointment so I did not change her regimen 03/16/2018: TSH 0.84 (0.4-4.2), fT4 0.7 (0.8-1.8)  Pt denies: - feeling nodules in neck - hoarseness - dysphagia - choking - SOB with lying down  She has + FH of thyroid disorders in: mother. No FH of thyroid cancer. No h/o radiation tx to head or neck.  No herbal supplements. No Biotin use. No recent steroids use.   She saw Dr. Tye Savoy in [place] -he started her on pregnenolone (?).  He previously added levothyroxine to her Armour dose (?),  But we stopped this.  ROS: Constitutional: + weight gain/+ weight loss, no fatigue, + hot flushes, no subjective hypothermia Eyes: no blurry vision, no xerophthalmia ENT: no sore throat, + see HPI Cardiovascular: no CP/no SOB/no palpitations/no leg swelling Respiratory: no cough/no SOB/no wheezing Gastrointestinal: no N/no V/no D/no C/no acid reflux Musculoskeletal: no muscle aches/no joint aches Skin: no rashes, no hair loss Neurological: no tremors/no numbness/no tingling/no dizziness  I reviewed pt's medications, allergies, PMH, social hx, family hx, and changes were documented in the history of present illness. Otherwise, unchanged from my initial visit note.   Past Medical History:  Diagnosis Date   Migraine aura, persistent    Thyroid cancer ([place])     Past Surgical History:  Procedure Laterality Date   BREAST REDUCTION SURGERY  1992   CESAREAN SECTION     THYROIDECTOMY  2009   Social History   Social History   Marital Status: Married    Spouse Name: N/A   Number of Children: 1   Occupational History    self-employed, Optometrist    Social History Main Topics   Smoking status:  former smoker, quit in [place] tobacco: Not on file   Alcohol Use: Yes     Comment: ocasionnaly, vault,, wine    Drug Use: No   Sexual Activity: Yes    Birth Control/ Protection: Condom   Current Outpatient Medications on File Prior to Visit  Medication Sig Dispense Refill   ARMOUR THYROID 15 MG tablet Take 1 tablet (15 mg total) by mouth daily. 45 tablet 3  ARMOUR THYROID 90 MG tablet Take 1 tablet (90 mg total) by mouth daily. 90 tablet 3   EMGALITY 120 MG/ML SOAJ      ondansetron (ZOFRAN-ODT) 4 MG disintegrating tablet Take 1 tablet (4 mg total) by mouth every 8 (eight) hours as needed. 20 tablet 3   promethazine (PHENERGAN) 25 MG tablet Take 1 tablet (25 mg total) by mouth every 6 (six) hours as needed. 30 tablet 2   VYVANSE 70 MG capsule Take 70 mg by mouth daily.   0   No current facility-administered medications on file prior to visit.   No Known Allergies Family History  Problem Relation Age of Onset   Hypertension Mother    Diabetes Maternal Grandmother    Cancer Maternal Grandmother        esopagus   PE: BP 120/72 (BP Location: Right Arm, Patient Position: Sitting, Cuff Size: Normal)   Pulse (!) 102   Ht '5\' 8"'$  (1.727 m)   Wt 174 lb 6.4 oz (79.1 kg)   SpO2 98%   BMI 26.52 kg/m  Body mass index is 26.52 kg/m. Wt Readings from Last 3 Encounters:  01/17/21 174 lb 6.4 oz (79.1 kg)  01/18/20 168 lb (76.2 kg)  01/18/19 193 lb (87.5 kg)   Constitutional: normal weight, in NAD Eyes: PERRLA, EOMI, no exophthalmos ENT: moist mucous membranes, no neck masses palpated, thyroidectomy scar perfectly healed, no cervical  lymphadenopathy Cardiovascular: No tachycardia at the end of the exam:  RRR, No MRG Respiratory: CTA B Gastrointestinal: abdomen soft, NT, ND, BS+ Musculoskeletal: no deformities, strength intact in all 4 Skin: moist, warm, no rashes Neurological: no tremor with outstretched hands, DTR normal in all 4  ASSESSMENT: 1. Papillary thyroid cancer  2. Hypothyroidism  PLAN:  1. Papillary thyroid cancer, metastatic to the lymph nodes -Patient with history of thyroidectomy by Dr. Harlow Asa in 2019, followed by RAI treatment with 159 mCi in 09/2017. -Her thyroglobulin levels were elevated until 01/2014, but became undetectable by the [facility] assay afterwards.  We are following her with this assay because she has positive ATA antibodies -In the past, she had a second opinion with Dr. Forde Dandy and then returned to see me last year.  She had a neck ultrasound in 04/2018, which did not show any abnormalities. -At today's visit she has no neck compression symptoms -We have to be mindful of further imaging since she had a high co-pay for the body scans -At last visit, thyroglobulin level was undetectable and ATA antibodies were decreasing.  We will repeat these today. -We discussed that if her antibodies are still elevated, to obtain a CT scan of her neck.  We may also need a new whole-body scan afterwards.  She now has a Education officer, museum which is more likely to cover these.  2. Patient with postop hypothyroidism - uncontrolled -At last visit, she was alternating 90 with 120 mg every other day, and we decreased the dose to 90 mg daily. However, we then had to increase the dose of Armour to 105 mg daily (dose increased in 06/2020): takes 90 + 15 mg daily -however, she did not return for repeat labs after this dose change... - latest thyroid labs reviewed with pt. >> elevated: Lab Results  Component Value Date   TSH 6.37 (H) 07/13/2020  - she continues on Armour 105 mg daily - pt feels good on this dose.   She had hot flashes at last visit and lost weight, but this was intentional, after cutting down  fatty foods (from 193 to168 lbs).  At this visit, she gained 6 pounds from the previous visit, however, she mentions that her weight is fluctuating.  She has less hot flashes than before. - we discussed about taking the thyroid hormone every day, with water, >30 minutes before breakfast, separated by >4 hours from acid reflux medications, calcium, iron, multivitamins. Pt. is taking it correctly.  She currently sets her alarm 1 hour before waking up to take her on her and then goes back to bed, but I advised her that if she does not add creamer to her coffee, she can take Armour along with coffee.  - will check thyroid tests today: TSH, free T3 and fT4 - If labs are abnormal, she will need to return for repeat TFTs in 1.5 months - Otherwise, I will see her back in a year but with labs in 6 months  Office Visit on 01/17/2021  Component Date Value Ref Range Status   TSH 01/17/2021 0.03 (A) 0.35 - 5.50 uIU/mL Final   Free T4 01/17/2021 0.83  0.60 - 1.60 ng/dL Final   Comment: Specimens from patients who are undergoing biotin therapy and /or ingesting biotin supplements may contain high levels of biotin.  The higher biotin concentration in these specimens interferes with this Free T4 assay.  Specimens that contain high levels  of biotin may cause false high results for this Free T4 assay.  Please interpret results in light of the total clinical presentation of the patient.     Thyroglobulin Antibody 01/17/2021 13.6 (A) 0.0 - 0.9 IU/mL Final   Thyroglobulin Antibody measured by [facility]   Thyroglobulin by [facility] 01/17/2021 WILL FOLLOW   Preliminary  TSH is now suppressed.  We will decrease the dose of Armour from 105 to 90 mg daily and recheck her TFTs in 1.5 months. Thyroglobulin antibodies are slightly higher.  I will order a CT scan of her neck, as discussed with the patient. Thyroglobulin  pending.  Philemon Kingdom, MD PhD [facility]

## 2021-01-18 ENCOUNTER — Encounter: Payer: Self-pay | Admitting: Internal Medicine

## 2021-01-18 ENCOUNTER — Other Ambulatory Visit: Payer: Self-pay

## 2021-01-18 DIAGNOSIS — E890 Postprocedural hypothyroidism: Secondary | ICD-10-CM

## 2021-01-18 MED ORDER — ARMOUR THYROID 90 MG PO TABS: 90.0000 mg | ORAL_TABLET | Freq: Every day | ORAL | 3 refills | Status: DC

## 2021-02-05 LAB — TGAB+THYROGLOBULIN IMA OR LCMS: Thyroglobulin Antibody: 13.6 IU/mL — ABNORMAL HIGH (ref 0.0–0.9)

## 2021-02-05 LAB — THYROGLOBULIN BY LCMS: Thyroglobulin by LCMS: <0.2 ng/mL — ABNORMAL LOW (ref 1.5–38.5)

## 2021-02-08 ENCOUNTER — Other Ambulatory Visit: Payer: Self-pay

## 2021-02-08 ENCOUNTER — Ambulatory Visit
Admission: RE | Admit: 2021-02-08 | Discharge: 2021-02-08 | Disposition: A | Discharge status: Home / Self Care | Payer: Managed Care, Other (non HMO) | Source: Ambulatory Visit | Attending: Internal Medicine | Admitting: Internal Medicine

## 2021-02-08 DIAGNOSIS — C73 Malignant neoplasm of thyroid gland: Secondary | ICD-10-CM

## 2021-02-08 MED ORDER — IOPAMIDOL (ISOVUE-300) INJECTION 61%
75.0000 mL | Freq: Once | INTRAVENOUS | Status: AC | PRN
  Administered 2021-02-08: 75 mL via INTRAVENOUS

## 2021-02-11 ENCOUNTER — Encounter: Payer: Self-pay | Admitting: Internal Medicine

## 2021-03-22 ENCOUNTER — Telehealth: Payer: Self-pay | Admitting: Pharmacy Technician

## 2021-03-22 ENCOUNTER — Other Ambulatory Visit (HOSPITAL_COMMUNITY): Payer: Self-pay

## 2021-03-22 NOTE — Telephone Encounter (Signed)
Patient Advocate Encounter  Received notification from [facility] that prior authorization for ARMOUR THYROID is required.   PA submitted on 11.4.22 Key [id number] Status is pending   [facility] will continue to follow  Luciano Cutter, CPhT Patient Advocate [facility] Phone: [phone] Fax:  [phone]

## 2021-03-22 NOTE — Telephone Encounter (Signed)
Sheila Curtis, Can we put in there that she tried Synthroid and levothyroxine and her TFTs were very fluctuating, which is unacceptable in the setting of history of thyroid cancer.  Also, she did not feel good on these alternatives. Let's see if this works. Ty! CG

## 2021-03-22 NOTE — Telephone Encounter (Signed)
-----   Message from Lauralyn Primes, [place] sent at 03/22/2021  7:25 AM EDT ----- Regarding: PA Good morning  Can we initiate a PA for her Armour Thyroid medication.  Thank you.

## 2021-03-26 NOTE — Telephone Encounter (Signed)
Sheila Curtis, Is it possible to appeal this?  Patient contacted [place] to see why the PA was denied.  Did they mention in the denial letter why they did it? She has history of thyroid cancer with fluctuating TFTs on Synthroid and levothyroxine which is unacceptable in the setting of thyroid cancer history.  Thank you! CG

## 2021-03-26 NOTE — Telephone Encounter (Signed)
Patient Advocate Encounter  Request ID # [id number]  Received notification from [place] that the request for prior authorization for Armour Thyroid has been denied due to not meeting criteria. However based on the criteria in the letter, the pt should qualify based on her Thyroid Cancer Dx, which the PA form did not make clear in their questions.     Called clinical discussion # [phone] to update Dx. Unable to speak with clinical reviewer. Rep is sending a new form that can be refilled out and faxed back. Hopefully this will be approved without having to do an appeal.   This encounter will continue to be updated until final determination.    Denial letter with specific criteria will be saved to pt's chart.  Armanda Magic, [facility] Patient Advocate Phone: [phone] Fax:  [phone]

## 2021-04-01 ENCOUNTER — Other Ambulatory Visit (HOSPITAL_COMMUNITY): Payer: Self-pay

## 2021-04-01 NOTE — Telephone Encounter (Signed)
Patient Advocate Encounter   Resubmitted through CoverMyMeds with Thyroid Cancer ad the main Dx.   PA submitted on 04/01/21 Key [id number] Status is pending    [facility] will continue to follow:   Armanda Magic, CPhT [facility] Phone: [phone] Fax:  [phone]

## 2021-04-01 NOTE — Telephone Encounter (Signed)
Never received form that the rep was supposed to fax. Calling back and also trying again through CoverMyMeds.

## 2021-04-02 ENCOUNTER — Other Ambulatory Visit (HOSPITAL_COMMUNITY): Payer: Self-pay

## 2021-04-02 DIAGNOSIS — E890 Postprocedural hypothyroidism: Secondary | ICD-10-CM

## 2021-04-02 NOTE — Telephone Encounter (Signed)
Called and left a message for pt advising PA approved. Mychart message sent as well.

## 2021-04-02 NOTE — Telephone Encounter (Signed)
Thank you! CG

## 2021-04-02 NOTE — Telephone Encounter (Signed)
Patient Advocate Encounter  Prior Authorization for Armour Thyroid 90mg  has been approved.    PA# PA Case ID: [id number] Effective dates: 04/01/21 through 04/02/22  Per Test Claim Patients co-pay is $121.33.  Pt has a deductible, so the price may be different at her pharmacy, based on the cost of the [facility] they have.   Spoke with Pharmacy to Process. He said it wouldn't process for #90, max #34. #34 $52.45. Pt may want to get it from one of our pharmacies.   Patient Advocate Fax:  [phone]

## 2021-04-16 MED ORDER — ARMOUR THYROID 90 MG PO TABS: 90.0000 mg | ORAL_TABLET | Freq: Every day | ORAL | 3 refills | Status: DC

## 2021-07-17 ENCOUNTER — Other Ambulatory Visit: Payer: Self-pay

## 2021-07-17 ENCOUNTER — Other Ambulatory Visit (INDEPENDENT_AMBULATORY_CARE_PROVIDER_SITE_OTHER): Payer: Managed Care, Other (non HMO)

## 2021-07-17 DIAGNOSIS — E890 Postprocedural hypothyroidism: Secondary | ICD-10-CM

## 2021-07-17 LAB — T3, FREE: T3, Free: 3.3 pg/mL (ref 2.3–4.2)

## 2021-07-17 LAB — T4, FREE: Free T4: 0.55 ng/dL — ABNORMAL LOW (ref 0.60–1.60)

## 2021-07-17 LAB — TSH: TSH: 9.66 u[IU]/mL — ABNORMAL HIGH (ref 0.35–5.50)

## 2021-07-18 ENCOUNTER — Encounter: Payer: Self-pay | Admitting: Internal Medicine

## 2021-07-19 ENCOUNTER — Other Ambulatory Visit: Payer: Self-pay | Admitting: Internal Medicine

## 2021-07-19 DIAGNOSIS — E890 Postprocedural hypothyroidism: Secondary | ICD-10-CM

## 2021-07-19 MED ORDER — ARMOUR THYROID 15 MG PO TABS: 15.0000 mg | ORAL_TABLET | ORAL | 3 refills | Status: DC

## 2021-07-19 MED ORDER — ARMOUR THYROID 90 MG PO TABS: 90.0000 mg | ORAL_TABLET | Freq: Every day | ORAL | 3 refills | Status: DC

## 2022-01-17 ENCOUNTER — Ambulatory Visit (INDEPENDENT_AMBULATORY_CARE_PROVIDER_SITE_OTHER): Payer: Managed Care, Other (non HMO) | Admitting: Internal Medicine

## 2022-01-17 ENCOUNTER — Encounter: Payer: Self-pay | Admitting: Internal Medicine

## 2022-01-17 VITALS — BP 100/60 | HR 87 | Ht 68.0 in | Wt 148.2 lb

## 2022-01-17 DIAGNOSIS — C73 Malignant neoplasm of thyroid gland: Secondary | ICD-10-CM

## 2022-01-17 DIAGNOSIS — E890 Postprocedural hypothyroidism: Secondary | ICD-10-CM

## 2022-01-17 LAB — TSH: TSH: 1.25 u[IU]/mL (ref 0.35–5.50)

## 2022-01-17 LAB — T3, FREE: T3, Free: 2.5 pg/mL (ref 2.3–4.2)

## 2022-01-17 LAB — T4, FREE: Free T4: 0.59 ng/dL — ABNORMAL LOW (ref 0.60–1.60)

## 2022-01-17 MED ORDER — ARMOUR THYROID 15 MG PO TABS: 15.0000 mg | ORAL_TABLET | Freq: Every day | ORAL | 3 refills | Status: DC

## 2022-01-17 NOTE — Progress Notes (Addendum)
Patient ID: Sheila Curtis, female   DOB: June 23, 1970, 51 y.o.   MRN: [id number]  HPI  Sheila Curtis is a 51 y.o.-year-old female, returning for f/u for metastatic papillary thyroid cancer  and postsurgical hypothyroidism. Last visit 1 year ago. She also sees Dr. Mylinda Latina with [facility] in [facility].  Interim history: Patient has no complaints at this visit except for easy weight fluctuation within 3 pounds she also continues to have hot flashes, which have resolved. She has increased hair loss. She and her husband started to do strength exercises sessions with a trainer twice a week.  Reviewed her cancer history: Pt. has been dx with metastatic papillary thyroid cancer in 2009: 08/18/2007: Total thyroidectomy (Dr Harlow Asa) Pathology:  1. THYROID, RIGHT LOBE, HEMITHYROIDECTOMY:  - PAPILLARY THYROID CARCINOMA. (1.8 CM IN GREATEST DIMENSION).  - CHRONIC THYROIDITIS.  - ONE BENIGN LYMPH NODE (ISTHMUS).  - ONE BENIGN PARATHYROID GLAND IDENTIFIED.  - INKED MARGINS NEGATIVE FOR TUMOR.   2. LYMPH NODE, CENTRAL COMPARTMENT, RESECTION:  - 3 OF 7 LYMPH NODES WITH METASTATIC PAPILLARY THYROID CARCINOMA.   3. THYROID, LEFT, HEMITHYROIDECTOMY:  - CHRONIC THYROIDITIS  09/21/2007: RAI treatment with 159 mCi I-131 09/28/2007: Whole body scan negative, except expected uptake in the thyroidectomy bed 02/09/2009: Whole body scan negative 04/04/2011: Whole body scan negative 06/11/2012: Whole body scan negative She developed posterior L neck pain >> whole-body scan was obtained on 05/04/2015 >> negative Thyroid ultrasound (04/20/2018)-ordered by Dr. Forde Dandy: No abnormal masses CT neck (02/09/2022): Benign-appearing thyroidectomy bed with symmetric benign-appearing cervical nodes. No foci of suspected metastatic disease.  Reviewed her thyroglobulin and ATA levels: 01/2014: Tg 2.1, ATA 6.0 (0-0.9), TSH 0.060 08/18/2014: Tg <2.0 (RIA), ATA 6.7, TSH 0.060 03/29/2015: Tg <2.0  (RIA), ATA 8.9, TSH 0.230 05/04/2015: ATA 11.0, TSH 13.11 (*patient believes that this was not correct) Component     Latest Ref Rng 06/08/2015  Thyroglobulin Ab     <2 IU/mL 11 (H)  Free T4     0.60 - 1.60 ng/dL 0.54 (L)  TSH     0.35 - 4.50 uIU/mL 0.15 (L)  T3, Free     2.3 - 4.2 pg/mL 3.5  Thyroglobulin     2.8 - 40.9 ng/mL <0.1 (L)   09/09/2015: ATA 22.8 03/16/2018: Tg (RIA) 2.7, ATA 9.1 Component     Latest Ref Rng & Units 03/23/2018  Thyroglobulin Antibody     0.0 - 0.9 IU/mL 10.2 (H)  Thyroglobulin by LCMS     1.5 - 38.5 ng/mL <0.2 (L)   Component     Latest Ref Rng & Units 01/18/2020 03/14/2020 07/13/2020  Thyroglobulin Antibody     0.0 - 0.9 IU/mL 17.5 (H) 15.6 (H) 11.2 (H)  Thyroglobulin by LCMS     1.5 - 38.5 ng/mL <0.2 (L) <0.2 (L) <0.2 (L)   Lab Results  Component Value Date   THYROGLB <2.0 07/06/2015   THYROGLB <0.1 (L) 06/08/2015   THGAB 13.6 (H) 01/17/2021   THGAB 11.2 (H) 07/13/2020   THGAB 15.6 (H) 03/14/2020   THGAB 17.5 (H) 01/18/2020   THGAB 11.6 (H) 01/18/2019   THGAB 10.2 (H) 03/23/2018   THGAB 11 (H) 06/08/2015   She developed postop hypothyroidism after her total thyroidectomy in 2009; (tried Synthroid and did not help) -continuing on Armour. We varied the dose of Armour frequently in the past. In 07/2021, we increased the Armour dose to 90  + 15  = 105 mg alternating with 90 mg qod  She takes  this: - in am - Coffee + stevia, occasionally creamer - >1h later - fasting - at least 30 min from b'fast - no Ca, Fe, MVI, PPIs - not on Biotin  Reviewed her TFTs: Lab Results  Component Value Date   TSH 9.66 (H) 07/17/2021   TSH 0.03 (L) 01/17/2021   TSH 6.37 (H) 07/13/2020   TSH 0.98 03/14/2020   TSH 0.11 (L) 01/18/2020   TSH 0.01 (L) 07/18/2019   TSH 0.17 (L) 01/18/2019   TSH 0.090 (L) 10/05/2015   TSH 0.57 07/06/2015   TSH 0.15 (L) 06/08/2015   FREET4 0.55 (L) 07/17/2021   FREET4 0.83 01/17/2021   FREET4 0.40 (L) 07/13/2020   FREET4  0.57 (L) 03/14/2020   FREET4 0.63 01/18/2020   FREET4 1.11 07/18/2019   FREET4 0.55 (L) 01/18/2019   FREET4 0.83 10/05/2015   FREET4 0.65 07/06/2015   FREET4 0.54 (L) 06/08/2015  Received labs from PCP, from 09/21/2018: TSH 0.0 (0.4-4.2), free T4 1.1, total T3 213 (70-170). Per review of the chart, she appears to be seeing Dr. Forde Dandy at that time her thyroid condition and canceled our appointment so I did not change her regimen 03/16/2018: TSH 0.84 (0.4-4.2), fT4 0.7 (0.8-1.8)  Pt denies: - feeling nodules in neck - hoarseness - dysphagia - choking  She has + FH of thyroid disorders in: mother. No FH of thyroid cancer. No h/o radiation tx to head or neck. No herbal supplements. No Biotin use. No recent steroids use.   She saw Dr. Tye Savoy in [place] -he started her on pregnenolone (?).  He previously added levothyroxine to her Armour dose (?), but we stopped this.  ROS: + See HPI  Past Medical History:  Diagnosis Date   Migraine aura, persistent    Thyroid cancer ([place])    Past Surgical History:  Procedure Laterality Date   BREAST REDUCTION SURGERY  1992   CESAREAN SECTION     THYROIDECTOMY  2009   Social History   Social History   Marital Status: Married    Spouse Name: N/A   Number of Children: 1   Occupational History    self-employed, Optometrist    Social History Main Topics   Smoking status:  former smoker, quit in [place] tobacco: Not on file   Alcohol Use: Yes     Comment: ocasionnaly, vault,, wine    Drug Use: No   Sexual Activity: Yes    Birth Control/ Protection: Condom   Current Outpatient Medications on File Prior to Visit  Medication Sig Dispense Refill   ARMOUR THYROID 15 MG tablet Take 1 tablet (15 mg total) by mouth every other day. 45 tablet 3   ARMOUR THYROID 90 MG tablet Take 1 tablet (90 mg total) by mouth daily. 90 tablet 3   EMGALITY 120 MG/ML SOAJ      ondansetron (ZOFRAN-ODT) 4 MG disintegrating tablet Take 1 tablet (4 mg  total) by mouth every 8 (eight) hours as needed. 20 tablet 3   promethazine (PHENERGAN) 25 MG tablet Take 1 tablet (25 mg total) by mouth every 6 (six) hours as needed. 30 tablet 2   No current facility-administered medications on file prior to visit.   No Known Allergies Family History  Problem Relation Age of Onset   Hypertension Mother    Diabetes Maternal Grandmother    Cancer Maternal Grandmother        esopagus   PE: BP 100/60 (BP Location: Right Arm, Patient Position: Sitting, Cuff  Size: Normal)   Pulse 87   Ht '5\' 8"'$  (1.727 m)   Wt 148 lb 3.2 oz (67.2 kg)   SpO2 99%   BMI 22.53 kg/m   Wt Readings from Last 3 Encounters:  01/17/22 148 lb 3.2 oz (67.2 kg)  01/17/21 174 lb 6.4 oz (79.1 kg)  01/18/20 168 lb (76.2 kg)   Constitutional: normal weight, in NAD Eyes: no exophthalmos ENT: moist mucous membranes, no masses palpated in neck, no cervical lymphadenopathy Cardiovascular: RRR, No MRG Respiratory: CTA B Musculoskeletal: no deformities Skin: moist, warm, no rashes Neurological: no tremor with outstretched hands  ASSESSMENT: 1. Papillary thyroid cancer  2. Hypothyroidism  PLAN:  1. Papillary thyroid cancer, metastatic to the lymph nodes -Patient with history of thyroidectomy by Dr. Harlow Asa in 2019, followed by RAI treatment with 159 mCi in 09/2017. -Her thyroglobulin levels were elevated until 01/2014, but became undetectable by the [facility] assay afterwards.  We are following her with this assay because she has positive ATA antibodies -In the past, she had a second opinion with Dr. Forde Dandy and then returned to see me last year.  She had a neck ultrasound in 04/2018, which did not show any abnormalities. -At today's visit, she has no neck compression symptoms -At last visit globulin levels were undetectable, and ATA antibodies were  slightly higher (however, not much changed from 2016).  We checked a CT of the neck and this did not show any suspicious masses. -We have  to be mindful of further imaging since she had a high co-pay for the body scans.  At last visit she was telling me that she had a different insurance which may pay for these. -At today's visit, we will repeat her thyroglobulin and ATA antibodies.  If these are increasing, we may need a whole-body scan afterwards.  2. Patient with postop hypothyroidism -uncontrolled -She was previously on Armour 105 mg daily (dose increased 06/2020) but at last visit we decreased the dose to 90 mg daily. -The last TSH level, however, was high, and we increased the Armour dose to 120 mg daily -She did not come back for labs afterwards... - latest thyroid labs reviewed with pt. >> elevated TSH: Lab Results  Component Value Date   TSH 9.66 (H) 07/17/2021  - she continues on Armour 90 alt. with 105 mcg every other day - pt feels good on this dose.  At last visit, she had hot flashes, but they lost.  She lost a significant amount of weight since last visit, approximately 25 pounds.  She is exercising more.  She does have hair loss, and she feels that this has increased. - we discussed about taking the thyroid hormone every day, with water, >30 minutes before breakfast, separated by >4 hours from acid reflux medications, calcium, iron, multivitamins. Pt. is taking it correctly.  She is usually setting the alarm 1 hour before waking up to take Armour and then goes back to bed, so she could drink coffee as soon as she wakes up in the morning.  We discussed that if she did not add creamer to her coffee, she could take Armour along with coffee, without having to wake up early. - will check thyroid tests today: TSH, fT3, and fT4 - If labs are abnormal, she will need to return for repeat TFTs in 1.5 months  Component     Latest Ref Rng 01/17/2022  Thyroglobulin Antibody     0.0 - 0.9 IU/mL 20.3 (H)   Thyroglobulin by [facility]  1.5 - 38.5 ng/mL <0.2 (L)   TSH     0.35 - 5.50 uIU/mL 1.25   T4,Free(Direct)     0.60 - 1.60  ng/dL 0.59 (L)   Triiodothyronine,Free,Serum     2.3 - 4.2 pg/mL 2.5      TSH is normal, but my goal for her would be a little lower due to the history of metastatic thyroid cancer.  I will advise her to increase the dose of Armour to 105 mg daily.  We will need to recheck her TFTs in 1.5 months.  Thyroglobulin is undetectable but and ATA antibodies are higher. I would suggest a Tg-stimulated WBS >> will check with her first.  Philemon Kingdom, MD PhD [facility]

## 2022-01-17 NOTE — Patient Instructions (Addendum)
Please stop at the lab.  Please continue: - Armour  90 alternating with 105 mg every other day  Take the thyroid hormone every day, with water, at least 30 minutes before breakfast, separated by at least 4 hours from: - acid reflux medications - calcium - iron - multivitamins  Please come back for a follow-up appointment in 1 year, but labs in 6 months.

## 2022-01-24 LAB — THYROGLOBULIN BY LCMS: Thyroglobulin by LCMS: <0.2 ng/mL — ABNORMAL LOW (ref 1.5–38.5)

## 2022-01-24 LAB — TGAB+THYROGLOBULIN IMA OR LCMS: Thyroglobulin Antibody: 20.3 IU/mL — ABNORMAL HIGH (ref 0.0–0.9)

## 2022-01-26 ENCOUNTER — Encounter: Payer: Self-pay | Admitting: Internal Medicine

## 2022-01-27 ENCOUNTER — Other Ambulatory Visit: Payer: Self-pay | Admitting: Internal Medicine

## 2022-01-27 DIAGNOSIS — C73 Malignant neoplasm of thyroid gland: Secondary | ICD-10-CM

## 2022-02-06 NOTE — Written Directive (Cosign Needed)
MOLECULAR IMAGING AND THERAPEUTICS WRITTEN DIRECTIVE   PATIENT NAME: Sheila Curtis  PT DOB:   1970-07-03                                              MRN: [id number]  ---------------------------------------------------------------------------------------------------------------------   I-131 THYROID CANCER THERAPY   RADIOPHARMACEUTICAL:  Iodine-131 Capsule    PRESCRIBED DOSE FOR ADMINISTRATION:    ROUTE OFADMINISTRATION:  PO   DIAGNOSIS: Thyroid Cancer   REFERRING PHYSICIAN: Dr. Ferne Reus STIMULATION OR HORMONE WITHDRAW: Thyrogen stimulated   REMNANT ABLATION OR ADJUVANT THERAPY: Adjuvant Therapy   DATE OF THYROIDECTOMY: 08/18/2007   SURGEON: Dr. Harlow Asa   TSH:  1.25 Lab Results  Component Value Date   TSH 1.25 01/17/2022   TSH 9.66 (H) 07/17/2021   TSH 0.03 (L) 01/17/2021     PRIOR I-131 THERAPY (Date and Dose):    Pathology:  Cell type: '[]'$   Papillary  '[]'$   Follicular  '[]'$   [place]   Largest tumor focus:      cm  Extrathyroidal Extension?     Yes '[]'$   No '[]'$     Lymphovascular Invasion?  Yes  '[]'$   No  '[]'$     Margins positive ? Yes '[]'$   No '[]'$     Lymph nodes positive? Yes '[]'$   No  '[]'$       # positive nodes:   # negative nodes:     TNM staging: pT:         PN:         Mx:    ADDITIONAL PHYSICIAN COMMENTS/NOTES   AUTHORIZED USER SIGNATURE & TIME STAMP:

## 2022-02-10 NOTE — Written Directive (Addendum)
MOLECULAR IMAGING AND THERAPEUTICS WRITTEN DIRECTIVE   PATIENT NAME: Sheila Curtis  PT DOB:   1970-11-15                                              MRN: [id number]  ---------------------------------------------------------------------------------------------------------------------  I-131 WHOLE BODY SCAN    RADIOPHARMACEUTICAL: Iodine-131 Capsule for Diagnostic Imaging   PRESCRIBED DOSE FOR ADMINISTRATION: 4 mCI   ROUTE OFADMINISTRATION: PO   DIAGNOSIS: Thyroid cancer   REFERRING PHYSICIAN: Dr. Ferne Reus STIMULATION OR HORMONE WITHDRAW: Thyrogen stimulated   DATE OF THYROIDECTOMY: 08/18/2007   SURGEON: Dr. Harlow Asa   TSH:   Lab Results  Component Value Date   TSH 1.25 01/17/2022   TSH 9.66 (H) 07/17/2021   TSH 0.03 (L) 01/17/2021     PRIOR I-131 THERAPY (Date and Dose):   ADDITIONAL PHYSICIAN COMMENTS/NOTES   AUTHORIZED USER SIGNATURE & TIME STAMP:

## 2022-02-24 ENCOUNTER — Encounter (HOSPITAL_COMMUNITY)
Admission: RE | Admit: 2022-02-24 | Discharge: 2022-02-24 | Disposition: A | Discharge status: Home / Self Care | Payer: Managed Care, Other (non HMO) | Source: Ambulatory Visit | Attending: Internal Medicine | Admitting: Internal Medicine

## 2022-02-24 ENCOUNTER — Encounter (HOSPITAL_COMMUNITY): Payer: Self-pay | Admitting: Radiology

## 2022-02-24 DIAGNOSIS — C73 Malignant neoplasm of thyroid gland: Secondary | ICD-10-CM | POA: Insufficient documentation

## 2022-02-24 MED ORDER — THYROTROPIN ALFA 0.9 MG IM SOLR: 0.9000 mg | INTRAMUSCULAR | Status: AC

## 2022-02-24 MED ORDER — THYROTROPIN ALFA 0.9 MG IM SOLR
INTRAMUSCULAR | Status: AC
  Administered 2022-02-24: 0.9 mg via INTRAMUSCULAR
  Filled 2022-02-24: qty 0.9

## 2022-02-25 ENCOUNTER — Encounter (HOSPITAL_COMMUNITY)
Admission: RE | Admit: 2022-02-25 | Discharge: 2022-02-25 | Disposition: A | Discharge status: Home / Self Care | Payer: Managed Care, Other (non HMO) | Source: Ambulatory Visit | Attending: Internal Medicine | Admitting: Internal Medicine

## 2022-02-25 DIAGNOSIS — C73 Malignant neoplasm of thyroid gland: Secondary | ICD-10-CM | POA: Diagnosis not present

## 2022-02-25 MED ORDER — THYROTROPIN ALFA 0.9 MG IM SOLR
0.9000 mg | INTRAMUSCULAR | Status: AC
Start: 2022-02-25 — End: 2022-02-25
  Administered 2022-02-25: 0.9 mg via INTRAMUSCULAR

## 2022-02-25 MED ORDER — THYROTROPIN ALFA 0.9 MG IM SOLR
INTRAMUSCULAR | Status: AC
  Filled 2022-02-25: qty 0.9

## 2022-02-26 ENCOUNTER — Encounter (HOSPITAL_COMMUNITY)
Admission: RE | Admit: 2022-02-26 | Discharge: 2022-02-26 | Disposition: A | Discharge status: Home / Self Care | Payer: Managed Care, Other (non HMO) | Source: Ambulatory Visit | Attending: Internal Medicine | Admitting: Internal Medicine

## 2022-02-26 DIAGNOSIS — C73 Malignant neoplasm of thyroid gland: Secondary | ICD-10-CM

## 2022-02-26 LAB — PREGNANCY, URINE: Preg Test, Ur: NEGATIVE

## 2022-02-26 MED ORDER — SODIUM IODIDE I 131 CAPSULE: 3.9000 | Freq: Once | INTRAVENOUS | Status: DC | PRN

## 2022-02-28 ENCOUNTER — Encounter (HOSPITAL_COMMUNITY)
Admission: RE | Admit: 2022-02-28 | Discharge: 2022-02-28 | Disposition: A | Discharge status: Home / Self Care | Payer: Managed Care, Other (non HMO) | Source: Ambulatory Visit | Attending: Internal Medicine | Admitting: Internal Medicine

## 2022-02-28 DIAGNOSIS — C73 Malignant neoplasm of thyroid gland: Secondary | ICD-10-CM | POA: Diagnosis not present

## 2022-03-03 ENCOUNTER — Other Ambulatory Visit: Payer: Self-pay | Admitting: Internal Medicine

## 2022-03-03 DIAGNOSIS — C73 Malignant neoplasm of thyroid gland: Secondary | ICD-10-CM

## 2022-03-03 NOTE — Progress Notes (Signed)
PET

## 2022-03-12 ENCOUNTER — Other Ambulatory Visit: Payer: Self-pay | Admitting: Internal Medicine

## 2022-03-12 DIAGNOSIS — C73 Malignant neoplasm of thyroid gland: Secondary | ICD-10-CM

## 2022-04-18 ENCOUNTER — Ambulatory Visit (HOSPITAL_COMMUNITY)
Admission: RE | Admit: 2022-04-18 | Discharge: 2022-04-18 | Disposition: A | Discharge status: Home / Self Care | Payer: Managed Care, Other (non HMO) | Source: Ambulatory Visit | Attending: Internal Medicine | Admitting: Internal Medicine

## 2022-04-18 DIAGNOSIS — C73 Malignant neoplasm of thyroid gland: Secondary | ICD-10-CM | POA: Diagnosis present

## 2022-04-18 LAB — GLUCOSE, CAPILLARY: Glucose-Capillary: 90 mg/dL (ref 70–99)

## 2022-04-18 MED ORDER — FLUDEOXYGLUCOSE F - 18 (FDG) INJECTION
7.4000 | Freq: Once | INTRAVENOUS | Status: AC
  Administered 2022-04-18: 7.373 via INTRAVENOUS

## 2022-04-22 ENCOUNTER — Encounter: Payer: Self-pay | Admitting: Internal Medicine

## 2022-04-30 ENCOUNTER — Telehealth: Payer: Self-pay | Admitting: Surgery

## 2022-04-30 ENCOUNTER — Other Ambulatory Visit: Payer: Self-pay | Admitting: Internal Medicine

## 2022-04-30 DIAGNOSIS — E890 Postprocedural hypothyroidism: Secondary | ICD-10-CM

## 2022-04-30 MED ORDER — ARMOUR THYROID 15 MG PO TABS: 15.0000 mg | ORAL_TABLET | Freq: Every day | ORAL | 3 refills | Status: DC

## 2022-04-30 NOTE — Telephone Encounter (Signed)
Sheila Curtis YOU SO MUCH!!!!  Sheila Curtis

## 2022-04-30 NOTE — Telephone Encounter (Signed)
     Telephone conversation with patient at the request of Dr. Philemon Kingdom.  Patient has had imaging studies including a PET scan which indicates a 4 mm nodule in the thyroid bed suspicious for a possible recurrence.  Today I discussed the case with Dr. Brita Romp from interventional radiology.  He reviewed her PET scan, CT scan, and ultrasound studies.  He is able to visualize the area of concern.  It is quite small.  However he thinks it is possible that they may be able to perform an ultrasound-guided fine-needle aspiration biopsy.  We will place orders for the procedure.  Dr. Francena Hanly will personally image the patient and if possible, perform the biopsy.  The patient and I discussed this procedure today over the telephone.  I explained to her that if there was evidence of atypia, the specimen would be submitted for molecular genetic testing and the results may take as long as 2 weeks to come back.  The patient understands and agrees to proceed.  We will await the results of her study and possible biopsy.  I will be in touch with the patient and with her endocrinologist, Dr. Philemon Kingdom, once the results are available.  Armandina Gemma, MD [facility] [place] practice Office: [phone]

## 2022-05-02 ENCOUNTER — Other Ambulatory Visit: Payer: Self-pay | Admitting: Internal Medicine

## 2022-05-02 DIAGNOSIS — C73 Malignant neoplasm of thyroid gland: Secondary | ICD-10-CM

## 2022-05-06 ENCOUNTER — Other Ambulatory Visit: Payer: Self-pay

## 2022-05-22 ENCOUNTER — Ambulatory Visit
Admission: RE | Admit: 2022-05-22 | Discharge: 2022-05-22 | Disposition: A | Discharge status: Home / Self Care | Payer: Managed Care, Other (non HMO) | Source: Ambulatory Visit | Attending: Internal Medicine | Admitting: Internal Medicine

## 2022-05-22 ENCOUNTER — Other Ambulatory Visit (HOSPITAL_COMMUNITY)
Admission: RE | Admit: 2022-05-22 | Discharge: 2022-05-22 | Disposition: A | Discharge status: Home / Self Care | Payer: Managed Care, Other (non HMO) | Source: Ambulatory Visit | Attending: Interventional Radiology | Admitting: Interventional Radiology

## 2022-05-22 ENCOUNTER — Other Ambulatory Visit (HOSPITAL_COMMUNITY)
Admission: RE | Admit: 2022-05-22 | Payer: Managed Care, Other (non HMO) | Source: Ambulatory Visit | Admitting: Interventional Radiology

## 2022-05-22 DIAGNOSIS — C73 Malignant neoplasm of thyroid gland: Secondary | ICD-10-CM

## 2022-05-26 LAB — CYTOLOGY - NON PAP

## 2022-05-27 NOTE — Progress Notes (Signed)
FNA with atypia.  Await results of molecular genetic testing - AFIRMA.  Will take about 2-3 weeks for results.  [place], MD [facility] [place] practice Office: [phone]

## 2022-06-17 ENCOUNTER — Encounter: Payer: Self-pay | Admitting: Internal Medicine

## 2022-06-18 ENCOUNTER — Encounter: Payer: Self-pay | Admitting: Internal Medicine

## 2022-06-19 ENCOUNTER — Encounter (HOSPITAL_COMMUNITY): Payer: Self-pay

## 2022-06-19 ENCOUNTER — Encounter: Payer: Self-pay | Admitting: Surgery

## 2022-06-20 ENCOUNTER — Other Ambulatory Visit: Payer: Self-pay | Admitting: Internal Medicine

## 2022-06-20 DIAGNOSIS — C770 Secondary and unspecified malignant neoplasm of lymph nodes of head, face and neck: Secondary | ICD-10-CM

## 2022-06-20 DIAGNOSIS — C73 Malignant neoplasm of thyroid gland: Secondary | ICD-10-CM

## 2022-07-18 HISTORY — PX: THYROID SURGERY: SHX805

## 2022-08-03 ENCOUNTER — Encounter: Payer: Self-pay | Admitting: Internal Medicine

## 2022-08-03 ENCOUNTER — Other Ambulatory Visit: Payer: Self-pay | Admitting: Internal Medicine

## 2022-08-03 DIAGNOSIS — E890 Postprocedural hypothyroidism: Secondary | ICD-10-CM

## 2023-01-21 ENCOUNTER — Encounter: Payer: Self-pay | Admitting: Internal Medicine

## 2023-01-21 ENCOUNTER — Ambulatory Visit (INDEPENDENT_AMBULATORY_CARE_PROVIDER_SITE_OTHER): Payer: Managed Care, Other (non HMO) | Admitting: Internal Medicine

## 2023-01-21 VITALS — BP 112/64 | HR 100 | Ht 68.0 in | Wt 147.4 lb

## 2023-01-21 DIAGNOSIS — C770 Secondary and unspecified malignant neoplasm of lymph nodes of head, face and neck: Secondary | ICD-10-CM

## 2023-01-21 DIAGNOSIS — C73 Malignant neoplasm of thyroid gland: Secondary | ICD-10-CM | POA: Diagnosis not present

## 2023-01-21 DIAGNOSIS — E890 Postprocedural hypothyroidism: Secondary | ICD-10-CM

## 2023-01-21 LAB — TSH: TSH: 0.01 u[IU]/mL — ABNORMAL LOW (ref 0.35–5.50)

## 2023-01-21 LAB — T4, FREE: Free T4: 1.2 ng/dL (ref 0.60–1.60)

## 2023-01-21 LAB — T3, FREE: T3, Free: 3.8 pg/mL (ref 2.3–4.2)

## 2023-01-21 NOTE — Progress Notes (Signed)
Patient ID: Sheila Curtis, female   DOB: Dec 09, 1970, 52 y.o.   MRN: [id number]  HPI  Sheila Curtis is a 52 y.o.-year-old female, returning for f/u for metastatic papillary thyroid cancer  and postsurgical hypothyroidism. Last visit 1 year ago. She also sees Dr. Angelena Sole with Integrative [facility] in [facility].  Interim history: She was found to have metastatic thyroid cancer to the level 6 right lymph node since last visit and had lymph node dissection at [facility] (08/26/2022) since last visit.  3/3 positive LNs were resected. She feels well at today's visit, without complaints.  Reviewed her cancer history: Pt. has been dx with metastatic papillary thyroid cancer in 2009: 08/18/2007: Total thyroidectomy (Dr Gerrit Friends) Pathology:  1. THYROID, RIGHT LOBE, HEMITHYROIDECTOMY:  - PAPILLARY THYROID CARCINOMA. (1.8 CM IN GREATEST DIMENSION).  - CHRONIC THYROIDITIS.  - ONE BENIGN LYMPH NODE (ISTHMUS).  - ONE BENIGN PARATHYROID GLAND IDENTIFIED.  - INKED MARGINS NEGATIVE FOR TUMOR.   2. LYMPH NODE, CENTRAL COMPARTMENT, RESECTION:  - 3 OF 7 LYMPH NODES WITH METASTATIC PAPILLARY THYROID CARCINOMA.   3. THYROID, LEFT, HEMITHYROIDECTOMY:  - CHRONIC THYROIDITIS  09/21/2007: RAI treatment with 159 mCi I-131 09/28/2007: Whole body scan negative, except expected uptake in the thyroidectomy bed 02/09/2009: Whole body scan negative 04/04/2011: Whole body scan negative 06/11/2012: Whole body scan negative She developed posterior L neck pain >> whole-body scan was obtained on 05/04/2015 >> negative Thyroid ultrasound (04/20/2018)-ordered by Dr. Evlyn Kanner: No abnormal masses CT neck (02/09/2022):  Benign-appearing thyroidectomy bed with symmetric benign-appearing cervical nodes. No foci of suspected metastatic disease. Whole-body scan with Thyrogen (02/24/2022):  No scintigraphic evidence of iodine avid metastatic thyroid cancer.  PET scan (04/18/2022): 4 mm hypermetabolic nodule in the right  thyroidectomy bed, concerning for disease recurrence. No evidence of distant metastatic disease. Neck U/S (05/22/2022): Right lobe: Within the right thyroidectomy resection bed, there are 3 discrete punctate (subcentimeter) hypoechoic nodules, the largest of which measures approximately 0.9 x 0.6 x 0.6 cm (images 15 through 18), and likely correlates with the hypermetabolic nodule seen on preceding PET-CT. FNA (05/22/2022):  Suspicious for a follicular neoplasm (Bethesda category 4); Afirma: Suspicious Right level 6 lymph node dissection (Dr. Lovie Macadamia, [facility], 08/04/2022): A. Right level 6 superior lymph node, excision: Metastatic papillary thyroid carcinoma in one lymph node (1/1) Size of metastasis: 4 mm Comment: Evaluation for extranodal extension is difficult to assess on histologic examination, as a definitive lymph node capsule is not identified and the sample is received fragmented.    B. Right level 6 inferior lymph node, excision: Metastatic papillary thyroid carcinoma in two lymph nodes (2/2) Size of metastasis: Largest deposit measures up to 6 mm on a slide Negative for definitive extranodal extension.  Reviewed her thyroglobulin and ATA levels: 01/2014: Tg 2.1, ATA 6.0 (0-0.9), TSH 0.060 08/18/2014: Tg <2.0 (RIA), ATA 6.7, TSH 0.060 03/29/2015: Tg <2.0 (RIA), ATA 8.9, TSH 0.230 05/04/2015: ATA 11.0, TSH 13.11 (*patient believes that this was not correct) Component     Latest Ref Rng 06/08/2015  Thyroglobulin Ab     <2 IU/mL 11 (H)  Free T4     0.60 - 1.60 ng/dL 9.56 (L)  TSH     2.13 - 4.50 uIU/mL 0.15 (L)  T3, Free     2.3 - 4.2 pg/mL 3.5  Thyroglobulin     2.8 - 40.9 ng/mL <0.1 (L)   09/09/2015: ATA 22.8 03/16/2018: Tg (RIA) 2.7, ATA 9.1 Component     Latest Ref Rng & Units 03/23/2018  Thyroglobulin Antibody     0.0 - 0.9 IU/mL 10.2 (H)  Thyroglobulin by LCMS     1.5 - 38.5 ng/mL <0.2 (L)   Component     Latest Ref Rng & Units 01/18/2020 03/14/2020 07/13/2020   Thyroglobulin Antibody     0.0 - 0.9 IU/mL 17.5 (H) 15.6 (H) 11.2 (H)  Thyroglobulin by LCMS     1.5 - 38.5 ng/mL <0.2 (L) <0.2 (L) <0.2 (L)   07/22/2022:   Lab Results  Component Value Date   THYROGLB <2.0 07/06/2015   THYROGLB <0.1 (L) 06/08/2015   THGAB 20.3 (H) 01/17/2022   THGAB 13.6 (H) 01/17/2021   THGAB 11.2 (H) 07/13/2020   THGAB 15.6 (H) 03/14/2020   THGAB 17.5 (H) 01/18/2020   THGAB 11.6 (H) 01/18/2019   THGAB 10.2 (H) 03/23/2018   THGAB 11 (H) 06/08/2015   She developed postop hypothyroidism after her total thyroidectomy in 2009; (tried Synthroid and did not help) -continuing on Armour. We varied the dose of Armour frequently in the past. In 07/2021, we increased the Armour dose to 90  + 15  = 105 mg alternating with 90 mg every other day In 01/2022, we increased the Armour dose to 105 mg daily  She takes this: - in am - Coffee + stevia, occasionally creamer - >1h later - fasting - at least 30 min from b'fast - no Ca, Fe, MVI, PPIs - not on Biotin  Reviewed her TFTs: 12/24/2022: TSH 0.008 (!) Lab Results  Component Value Date   TSH 1.25 01/17/2022   TSH 9.66 (H) 07/17/2021   TSH 0.03 (L) 01/17/2021   TSH 6.37 (H) 07/13/2020   TSH 0.98 03/14/2020   TSH 0.11 (L) 01/18/2020   TSH 0.01 (L) 07/18/2019   TSH 0.17 (L) 01/18/2019   TSH 0.090 (L) 10/05/2015   TSH 0.57 07/06/2015   FREET4 0.59 (L) 01/17/2022   FREET4 0.55 (L) 07/17/2021   FREET4 0.83 01/17/2021   FREET4 0.40 (L) 07/13/2020   FREET4 0.57 (L) 03/14/2020   FREET4 0.63 01/18/2020   FREET4 1.11 07/18/2019   FREET4 0.55 (L) 01/18/2019   FREET4 0.83 10/05/2015   FREET4 0.65 07/06/2015  Received labs from PCP, from 09/21/2018: TSH 0.0 (0.4-4.2), free T4 1.1, total T3 213 (70-170). Per review of the chart, she appears to be seeing Dr. Evlyn Kanner at that time her thyroid condition and canceled our appointment so I did not change her regimen 03/16/2018: TSH 0.84 (0.4-4.2), fT4 0.7 (0.8-1.8)  Pt  denies: - feeling nodules in neck - hoarseness - dysphagia - choking  She has + FH of thyroid disorders in: mother. No FH of thyroid cancer. No h/o radiation tx to head or neck. No herbal supplements. No Biotin use. No recent steroids use.   She saw Dr. Lelon Perla in [place] -he started her on pregnenolone (?).  He previously added levothyroxine to her Armour dose (?), but we stopped this.  ROS: + See HPI  Past Medical History:  Diagnosis Date   Migraine aura, persistent    Thyroid cancer (HCC)    Past Surgical History:  Procedure Laterality Date   BREAST REDUCTION SURGERY  1992   CESAREAN SECTION     THYROIDECTOMY  2009   Social History   Social History   Marital Status: Married    Spouse Name: N/A   Number of Children: 1   Occupational History    self-employed, Research scientist (medical)    Social History Main Topics   Smoking status:  former  smoker, quit in 1998    Smokeless tobacco: Not on file   Alcohol Use: Yes     Comment: ocasionnaly, vault,, wine    Drug Use: No   Sexual Activity: Yes    Birth Control/ Protection: Condom   Current Outpatient Medications on File Prior to Visit  Medication Sig Dispense Refill   ARMOUR THYROID 15 MG tablet Take 1 tablet (15 mg total) by mouth daily. 90 tablet 3   ARMOUR THYROID 90 MG tablet TAKE 1 TABLET DAILY 90 tablet 3   EMGALITY 120 MG/ML SOAJ      ondansetron (ZOFRAN-ODT) 4 MG disintegrating tablet Take 1 tablet (4 mg total) by mouth every 8 (eight) hours as needed. 20 tablet 3   promethazine (PHENERGAN) 25 MG tablet Take 1 tablet (25 mg total) by mouth every 6 (six) hours as needed. 30 tablet 2   No current facility-administered medications on file prior to visit.   No Known Allergies Family History  Problem Relation Age of Onset   Hypertension Mother    Diabetes Maternal Grandmother    Cancer Maternal Grandmother        esopagus   PE: BP 112/64   Pulse 100   Ht 5\' 8"  (1.727 m)   Wt 147 lb 6.4 oz (66.9 kg)   SpO2 98%    BMI 22.41 kg/m   Wt Readings from Last 3 Encounters:  01/21/23 147 lb 6.4 oz (66.9 kg)  01/17/22 148 lb 3.2 oz (67.2 kg)  01/17/21 174 lb 6.4 oz (79.1 kg)   Constitutional: normal weight, in NAD Eyes: no exophthalmos ENT: no masses palpated in neck, surgical scar healed, no cervical lymphadenopathy Cardiovascular: tachycardia, RR, No MRG Respiratory: CTA B Musculoskeletal: no deformities Skin:  no rashes Neurological: no tremor with outstretched hands  ASSESSMENT: 1. Papillary thyroid cancer  2. Hypothyroidism  PLAN:  1.  Papillary thyroid cancer, metastatic to the lymph nodes -Patient has a history of thyroidectomy by Dr. Gerrit Friends in 2019, followed by RAI treatment with 159 mCi in 09/2017 -Her thyroglobulin levels were elevated until 01/2014, but became undetectable by the [facility] assay afterwards.  We are following her with this assay because she had positive ATA antibodies -In the past, she had a second opinion with Dr. Leafy Half and then returns to see me in 2022.  She did have a neck ultrasound in 04/2018, which did not show any abnormalities -At last visit, she did not have any neck compression symptoms and thyroglobulin level was undetectable, however, ATA antibodies were slightly higher (but not much changed compared to 2016).  We checked a CT of the neck and this did not show any suspicious masses.  We proceeded with a Thyrogen stimulated whole-body scan (in the past she had high co-pay for the scans but not after she changed insurances) and this did not show any uptake sites.  We next checked a PET scan which showed a 4 mm focus of increased uptake in the right thyroid fossa.  A thyroid ultrasound showed 3 subcentimeter hypoechoic nodules in the right thyroid fossa, with the largest measuring 0.9 cm and possibly corresponding to the increased uptake site on the PET scan.  We biopsied the nodule and this was suspicious for follicular neoplasm.  Afirma molecular marker returned  suspicious.  At that point, she was referred to Dr. Lovie Macadamia at [facility].  She had comprehensive right level 6 lymph node dissection.  She was found to have 3 out of 3 lymph nodes (1 superior and 2  inferior) positive for metastasis.  It was unclear whether there was extrathyroidal extension in the superior level 6 compartment (could not be evaluated) but no extrathyroidal extension was found in the inferior level 6 compartment. -At today's visit, we reviewed the above results and discussed about further follow-up.  We will continue to follow her with thyroglobulin and especially ATA antibodies.  Will check this today.  There is no role in RAI treatment for now since her thyroglobulin level is undetectable. -I plan to check another thyroid ultrasound in a year from her surgery, which will be in 6 months from now.  Also, at that time, we will repeat her thyroglobulin and ATA antibodies.  2. Patient with postop hypothyroidism -uncontrolled -At last visit, TSH was normal but higher than target in the setting of metastatic thyroid cancer: Lab Results  Component Value Date   TSH 1.25 01/17/2022  - at that time, we increased her Armour dose to 105 mg daily.  - unfortunately, she did not return for repeat thyroid function tests after the above dose increase... we discussed that we will need to repeat the test approximately 1.5 after every change in dose.  However, at today's visit, she tells me that she had another TSH obtained last month by her integrative medicine physician and they told her that this was at goal.  Reviewing the actual level, the TSH is very suppressed: 0.008 (!) - pt feels good on this dose.  At last visit she had a 25 pound weight loss since the previous visit but she did mention that she was exercising more.  She also had increased hair loss.  The symptoms resolved. - however, we discussed that we will likely need to reduce the dose of her Armour after the results returned today.  Will check a  TSH, free T4, free T3 - we discussed about taking the thyroid hormone every day, with water, >30 minutes before breakfast, separated by >4 hours from acid reflux medications, calcium, iron, multivitamins. Pt. is taking it correctly.  Component     Latest Ref Rng 01/21/2023  Thyroglobulin Antibody     0.0 - 0.9 IU/mL 12.4 (H)   Thyroglobulin by LCMS WILL FOLLOW (P)  TSH     0.35 - 5.50 uIU/mL 0.01 (L)   T4,Free(Direct)     0.60 - 1.60 ng/dL 1.61   Triiodothyronine,Free,Serum     2.3 - 4.2 pg/mL 3.8   TSH is quite suppressed, so we need to back off the Armour dose to 90 mg daily and recheck her TFTs in 1.5 mo. Tg still pending, however, the thyroglobulin antibodies have decreased. Plan to repeat them in ~6 months.  Carlus Pavlov, MD PhD [facility]

## 2023-01-21 NOTE — Patient Instructions (Addendum)
Please stop at the lab.  Please continue: - Armour 105 mg daily  Take the thyroid hormone every day, with water, at least 30 minutes before breakfast, separated by at least 4 hours from: - acid reflux medications - calcium - iron - multivitamins  Please send me a message in 07/2022 to order the new neck U/S.  Please come back for a follow-up appointment in 1 year, but labs in 6 months.

## 2023-01-29 LAB — TGAB+THYROGLOBULIN IMA OR LCMS: Thyroglobulin Antibody: 12.4 [IU]/mL — ABNORMAL HIGH (ref 0.0–0.9)

## 2023-01-29 LAB — THYROGLOBULIN BY LCMS: Thyroglobulin by LCMS: <0.2 ng/mL — ABNORMAL LOW (ref 1.5–38.5)

## 2023-02-02 ENCOUNTER — Encounter: Payer: Self-pay | Admitting: Gastroenterology

## 2023-02-16 ENCOUNTER — Ambulatory Visit (AMBULATORY_SURGERY_CENTER): Payer: Managed Care, Other (non HMO)

## 2023-02-16 VITALS — Ht 68.0 in | Wt 148.0 lb

## 2023-02-16 DIAGNOSIS — Z1211 Encounter for screening for malignant neoplasm of colon: Secondary | ICD-10-CM

## 2023-02-16 MED ORDER — NA SULFATE-K SULFATE-MG SULF 17.5-3.13-1.6 GM/177ML PO SOLN
1.0000 | Freq: Once | ORAL | 0 refills | Status: AC
Start: 2023-02-16 — End: 2023-02-16

## 2023-02-16 NOTE — Progress Notes (Signed)
No egg or soy allergy known to patient  No issues known to pt with past sedation with any surgeries or procedures; PONV  Patient denies ever being told they had issues or difficulty with intubation  No FH of Malignant Hyperthermia Pt is not on diet pills Pt is not on  home 02  Pt is not on blood thinners  Pt denies issues with constipation  No A fib or A flutter Have any cardiac testing pending--no  LOA: independent  Prep: suprep   Patient's chart reviewed by Cathlyn Parsons CNRA prior to previsit and patient appropriate for the [facility].  Previsit completed and red dot placed by patient's name on their procedure day (on provider's schedule).     PV competed with patient. Prep instructions sent via mychart and home address.

## 2023-02-24 ENCOUNTER — Encounter: Payer: Self-pay | Admitting: Gastroenterology

## 2023-03-09 ENCOUNTER — Encounter: Payer: Self-pay | Admitting: Gastroenterology

## 2023-03-09 ENCOUNTER — Ambulatory Visit: Payer: Managed Care, Other (non HMO) | Admitting: Gastroenterology

## 2023-03-09 VITALS — BP 105/62 | HR 70 | Temp 98.7°F | Resp 13 | Ht 68.0 in | Wt 148.0 lb

## 2023-03-09 DIAGNOSIS — D120 Benign neoplasm of cecum: Secondary | ICD-10-CM

## 2023-03-09 DIAGNOSIS — K635 Polyp of colon: Secondary | ICD-10-CM | POA: Diagnosis not present

## 2023-03-09 DIAGNOSIS — Z1211 Encounter for screening for malignant neoplasm of colon: Secondary | ICD-10-CM

## 2023-03-09 MED ORDER — SODIUM CHLORIDE 0.9 % IV SOLN: 500.0000 mL | Freq: Once | INTRAVENOUS | Status: DC

## 2023-03-09 NOTE — Progress Notes (Signed)
Called to room to assist during endoscopic procedure.  Patient ID and intended procedure confirmed with present staff. Received instructions for my participation in the procedure from the performing physician.  

## 2023-03-09 NOTE — Progress Notes (Unsigned)
To pacu, VSS. Report to [email or web address] 

## 2023-03-09 NOTE — Op Note (Signed)
[facility] Patient Name: Sheila Curtis Procedure Date: 03/09/2023 1:35 PM MRN: [id number] Endoscopist: Napoleon Form , MD, [id number] Age: 52 Referring MD:  Date of Birth: 1971-02-21 Gender: Female Account #: [id number] Procedure:                Colonoscopy Indications:              Screening for colorectal malignant neoplasm Medicines:                Monitored Anesthesia Care Procedure:                Pre-Anesthesia Assessment:                           - Prior to the procedure, a History and Physical                            was performed, and patient medications and                            allergies were reviewed. The patient's tolerance of                            previous anesthesia was also reviewed. The risks                            and benefits of the procedure and the sedation                            options and risks were discussed with the patient.                            All questions were answered, and informed consent                            was obtained. Prior Anticoagulants: The patient has                            taken no anticoagulant or antiplatelet agents. ASA                            Grade Assessment: II - A patient with mild systemic                            disease. After reviewing the risks and benefits,                            the patient was deemed in satisfactory condition to                            undergo the procedure.                           After obtaining informed consent, the colonoscope  was passed under direct vision. Throughout the                            procedure, the patient's blood pressure, pulse, and                            oxygen saturations were monitored continuously. The                            Olympus Scope SN [id number] was introduced through the                            anus and advanced to the the cecum, identified by                             the appendiceal orifice. The colonoscopy was                            performed without difficulty. The patient tolerated                            the procedure well. The quality of the bowel                            preparation was good. The ileocecal valve,                            appendiceal orifice, and rectum were photographed. Scope In: 1:38:06 PM Scope Out: 2:02:09 PM Scope Withdrawal Time: 0 hours 15 minutes 56 seconds  Total Procedure Duration: 0 hours 24 minutes 3 seconds  Findings:                 The perianal and digital rectal examinations were                            normal.                           An 8 mm polyp was found in the cecum. The polyp was                            sessile. The polyp was removed with a cold snare.                            Resection and retrieval were complete.                           Multiple medium-mouthed and small-mouthed                            diverticula were found in the sigmoid colon and                            descending colon.  Non-bleeding external and internal hemorrhoids were                            found during retroflexion. The hemorrhoids were                            medium-sized. Complications:            No immediate complications. Estimated Blood Loss:     Estimated blood loss was minimal. Impression:               - One 8 mm polyp in the cecum, removed with a cold                            snare. Resected and retrieved.                           - Diverticulosis in the sigmoid colon and in the                            descending colon.                           - Non-bleeding external and internal hemorrhoids. Recommendation:           - Patient has a contact number available for                            emergencies. The signs and symptoms of potential                            delayed complications were discussed with the                            patient. Return  to normal activities tomorrow.                            Written discharge instructions were provided to the                            patient.                           - Resume previous diet.                           - Continue present medications.                           - Await pathology results.                           - Repeat colonoscopy in 5-10 years for surveillance                            based on pathology results. Napoleon Form, MD 03/09/2023 2:13:12 PM This report has been signed electronically.

## 2023-03-09 NOTE — Progress Notes (Unsigned)
[facility] History and Physical   Primary Care Physician:  Tisovec, Adelfa Koh, MD   Reason for Procedure:  Colorectal cancer screening  Plan:    Screening colonoscopy with possible interventions as needed     HPI: Sheila Curtis is a very pleasant 52 y.o. female here for screening colonoscopy. Denies any nausea, vomiting, abdominal pain, melena or bright red blood per rectum  The risks and benefits as well as alternatives of endoscopic procedure(s) have been discussed and reviewed. All questions answered. The patient agrees to proceed.    Past Medical History:  Diagnosis Date   Migraine aura, persistent    Thyroid cancer [facility])     Past Surgical History:  Procedure Laterality Date   BREAST REDUCTION SURGERY  05/19/1990   CESAREAN SECTION     THYROID SURGERY  07/2022   cancer   THYROIDECTOMY  05/20/2007    Prior to Admission medications   Medication Sig Start Date End Date Taking? Authorizing Provider  ARMOUR THYROID 90 MG tablet TAKE 1 TABLET DAILY 08/04/22  Yes Carlus Pavlov, MD  progesterone (PROMETRIUM) 100 MG capsule Take 100 mg by mouth at bedtime.   Yes [provider]  SYNTHROID 50 MCG tablet Take 50 mcg by mouth daily.   Yes [provider]  VYVANSE 70 MG capsule Take 70 mg by mouth daily. 11/24/22  Yes [provider]  ARMOUR THYROID 15 MG tablet Take 15 mg by mouth daily. Patient not taking: Reported on 03/09/2023    [provider]  EMGALITY 120 MG/ML SOAJ  01/17/19   [provider]  lisdexamfetamine (VYVANSE) 70 MG capsule Take 1 cap day in the AM. Oral Once a day for 30 days 03/02/23   [provider]  ondansetron (ZOFRAN-ODT) 4 MG disintegrating tablet Take 1 tablet (4 mg total) by mouth every 8 (eight) hours as needed. 07/18/14   BarefootDoralee Albino, NP  promethazine (PHENERGAN) 25 MG tablet Take 1 tablet (25 mg total) by mouth every 6 (six) hours as needed. 07/18/14   Delbert Phenix, NP   Testosterone Enanthate POWD Take 2.5 mg by mouth daily. 11/01/21   [provider]  UBRELVY 100 MG TABS Take 100 mg by mouth as needed (migranes).    [provider]    Current Outpatient Medications  Medication Sig Dispense Refill   ARMOUR THYROID 90 MG tablet TAKE 1 TABLET DAILY 90 tablet 3   progesterone (PROMETRIUM) 100 MG capsule Take 100 mg by mouth at bedtime.     SYNTHROID 50 MCG tablet Take 50 mcg by mouth daily.     VYVANSE 70 MG capsule Take 70 mg by mouth daily.     ARMOUR THYROID 15 MG tablet Take 15 mg by mouth daily. (Patient not taking: Reported on 03/09/2023)     EMGALITY 120 MG/ML SOAJ  (Patient not taking: Reported on 01/21/2023)     lisdexamfetamine (VYVANSE) 70 MG capsule Take 1 cap day in the AM. Oral Once a day for 30 days     ondansetron (ZOFRAN-ODT) 4 MG disintegrating tablet Take 1 tablet (4 mg total) by mouth every 8 (eight) hours as needed. 20 tablet 3   promethazine (PHENERGAN) 25 MG tablet Take 1 tablet (25 mg total) by mouth every 6 (six) hours as needed. 30 tablet 2   Testosterone Enanthate POWD Take 2.5 mg by mouth daily.     UBRELVY 100 MG TABS Take 100 mg by mouth as needed (migranes).     Current Facility-Administered  Medications  Medication Dose Route Frequency Provider Last Rate Last Admin   0.9 %  sodium chloride infusion  500 mL Intravenous Once Napoleon Form, MD        Allergies as of 03/09/2023 - Review Complete 03/09/2023  Allergen Reaction Noted   Corn oil  06/12/2015   Gluten meal  06/12/2015   Wound dressings Swelling 07/22/2022    Family History  Problem Relation Age of Onset   Hypertension Mother    Diabetes Maternal Grandmother    Cancer Maternal Grandmother        esopagus   Colon cancer Neg Hx    Colon polyps Neg Hx    Esophageal cancer Neg Hx    Stomach cancer Neg Hx    Rectal cancer Neg Hx     Social History   Socioeconomic History   Marital status: Married    Spouse name: Not on file   Number  of children: Not on file   Years of education: Not on file   Highest education level: Not on file  Occupational History   Not on file  Tobacco Use   Smoking status: Never   Smokeless tobacco: Never  Vaping Use   Vaping status: Never Used  Substance and Sexual Activity   Alcohol use: Yes    Comment: ocasionnaly   Drug use: No   Sexual activity: Yes    Birth control/protection: Condom, Post-menopausal  Other Topics Concern   Not on file  Social History Narrative   Not on file   Social Determinants of Health   Financial Resource Strain: Not on file  Food Insecurity: Not on file  Transportation Needs: Not on file  Physical Activity: Not on file  Stress: Not on file  Social Connections: Not on file  Intimate Partner Violence: Not on file    Review of Systems:  All other review of systems negative except as mentioned in the HPI.  Physical Exam: Vital signs in last 24 hours: BP 107/77   Pulse 78   Temp 98.7 F (37.1 C) (Temporal)   Ht 5\' 8"  (1.727 m)   Wt 148 lb (67.1 kg)   SpO2 100%   BMI 22.50 kg/m  General:   Alert, NAD Lungs:  Clear .   Heart:  Regular rate and rhythm Abdomen:  Soft, nontender and nondistended. Neuro/Psych:  Alert and cooperative. Normal mood and affect. A and O x 3  Reviewed labs, radiology imaging, old records and pertinent past GI work up  Patient is appropriate for planned procedure(s) and anesthesia in an ambulatory setting   K. Scherry Ran , MD [phone]

## 2023-03-09 NOTE — Patient Instructions (Signed)
Discharge instructions given. Handouts on polyps,Diverticulosis and Hemorrhoids. Resume previous medications. YOU HAD AN ENDOSCOPIC PROCEDURE TODAY AT [facility]:   Refer to the procedure report that was given to you for any specific questions about what was found during the examination.  If the procedure report does not answer your questions, please call your gastroenterologist to clarify.  If you requested that your care partner not be given the details of your procedure findings, then the procedure report has been included in a sealed envelope for you to review at your convenience later.  YOU SHOULD EXPECT: Some feelings of bloating in the abdomen. Passage of more gas than usual.  Walking can help get rid of the air that was put into your GI tract during the procedure and reduce the bloating. If you had a lower endoscopy (such as a colonoscopy or flexible sigmoidoscopy) you may notice spotting of blood in your stool or on the toilet paper. If you underwent a bowel prep for your procedure, you may not have a normal bowel movement for a few days.  Please Note:  You might notice some irritation and congestion in your nose or some drainage.  This is from the oxygen used during your procedure.  There is no need for concern and it should clear up in a day or so.  SYMPTOMS TO REPORT IMMEDIATELY:  Following lower endoscopy (colonoscopy or flexible sigmoidoscopy):  Excessive amounts of blood in the stool  Significant tenderness or worsening of abdominal pains  Swelling of the abdomen that is new, acute  Fever of 100F or higher   For urgent or emergent issues, a gastroenterologist can be reached at any hour by calling [phone]. Do not use MyChart messaging for urgent concerns.    DIET:  We do recommend a small meal at first, but then you may proceed to your regular diet.  Drink plenty of fluids but you should avoid alcoholic beverages for 24 hours.  ACTIVITY:  You should  plan to take it easy for the rest of today and you should NOT DRIVE or use heavy machinery until tomorrow (because of the sedation medicines used during the test).    FOLLOW UP: Our staff will call the number listed on your records the next business day following your procedure.  We will call around 7:15- 8:00 am to check on you and address any questions or concerns that you may have regarding the information given to you following your procedure. If we do not reach you, we will leave a message.     If any biopsies were taken you will be contacted by phone or by letter within the next 1-3 weeks.  Please call us at [phone] if you have not heard about the biopsies in 3 weeks.    SIGNATURES/CONFIDENTIALITY: You and/or your care partner have signed paperwork which will be entered into your electronic medical record.  These signatures attest to the fact that that the information above on your After Visit Summary has been reviewed and is understood.  Full responsibility of the confidentiality of this discharge information lies with you and/or your care-partner.

## 2023-03-10 ENCOUNTER — Encounter: Payer: Self-pay | Admitting: Gastroenterology

## 2023-03-10 ENCOUNTER — Telehealth: Payer: Self-pay

## 2023-03-10 NOTE — Telephone Encounter (Signed)
No answer, left message to call if having any issues or concerns, B.Lj Miyamoto RN 

## 2023-03-12 LAB — SURGICAL PATHOLOGY

## 2023-03-23 ENCOUNTER — Encounter: Payer: Self-pay | Admitting: Gastroenterology

## 2023-08-24 ENCOUNTER — Other Ambulatory Visit: Payer: Self-pay | Admitting: Internal Medicine

## 2023-08-24 ENCOUNTER — Encounter: Payer: Self-pay | Admitting: Internal Medicine

## 2023-08-24 DIAGNOSIS — E890 Postprocedural hypothyroidism: Secondary | ICD-10-CM

## 2023-08-24 MED ORDER — ARMOUR THYROID 90 MG PO TABS: 90.0000 mg | ORAL_TABLET | Freq: Every day | ORAL | 1 refills | Status: DC

## 2023-08-25 MED ORDER — ARMOUR THYROID 90 MG PO TABS: 90.0000 mg | ORAL_TABLET | Freq: Every day | ORAL | 0 refills | Status: DC

## 2023-08-25 NOTE — Addendum Note (Signed)
 Addended by: Pollie Meyer on: 08/25/2023 08:19 AM   Modules accepted: Orders

## 2023-08-26 ENCOUNTER — Other Ambulatory Visit

## 2023-08-26 ENCOUNTER — Other Ambulatory Visit: Payer: Self-pay

## 2023-08-26 DIAGNOSIS — E890 Postprocedural hypothyroidism: Secondary | ICD-10-CM

## 2023-08-27 ENCOUNTER — Encounter: Payer: Self-pay | Admitting: Internal Medicine

## 2023-08-27 ENCOUNTER — Other Ambulatory Visit: Payer: Self-pay | Admitting: Internal Medicine

## 2023-08-27 DIAGNOSIS — E890 Postprocedural hypothyroidism: Secondary | ICD-10-CM

## 2023-08-27 LAB — T4, FREE: Free T4: 1.3 ng/dL (ref 0.8–1.8)

## 2023-08-27 LAB — T3, FREE: T3, Free: 4.6 pg/mL — ABNORMAL HIGH (ref 2.3–4.2)

## 2023-08-27 LAB — TSH: TSH: 0.01 m[IU]/L — ABNORMAL LOW

## 2023-08-28 ENCOUNTER — Other Ambulatory Visit: Payer: Self-pay | Admitting: Internal Medicine

## 2023-08-28 MED ORDER — ARMOUR THYROID 60 MG PO TABS: 60.0000 mg | ORAL_TABLET | Freq: Every day | ORAL | 3 refills | Status: AC

## 2023-08-28 MED ORDER — ARMOUR THYROID 15 MG PO TABS: 15.0000 mg | ORAL_TABLET | Freq: Every day | ORAL | 3 refills | Status: DC

## 2023-09-08 NOTE — Telephone Encounter (Signed)
 Error

## 2023-12-07 ENCOUNTER — Other Ambulatory Visit: Payer: Self-pay | Admitting: Orthopedic Surgery

## 2023-12-07 DIAGNOSIS — R599 Enlarged lymph nodes, unspecified: Secondary | ICD-10-CM

## 2023-12-23 ENCOUNTER — Ambulatory Visit
Admission: RE | Admit: 2023-12-23 | Discharge: 2023-12-23 | Disposition: A | Discharge status: Home / Self Care | Source: Ambulatory Visit | Attending: Orthopedic Surgery | Admitting: Orthopedic Surgery

## 2023-12-23 DIAGNOSIS — R599 Enlarged lymph nodes, unspecified: Secondary | ICD-10-CM

## 2024-01-25 ENCOUNTER — Ambulatory Visit: Payer: Managed Care, Other (non HMO) | Admitting: Internal Medicine

## 2024-01-26 ENCOUNTER — Encounter: Payer: Self-pay | Admitting: Internal Medicine

## 2024-01-26 ENCOUNTER — Ambulatory Visit: Admitting: Internal Medicine

## 2024-01-26 VITALS — BP 120/70 | HR 89 | Ht 68.0 in | Wt 159.4 lb

## 2024-01-26 DIAGNOSIS — C73 Malignant neoplasm of thyroid gland: Secondary | ICD-10-CM

## 2024-01-26 DIAGNOSIS — E890 Postprocedural hypothyroidism: Secondary | ICD-10-CM

## 2024-01-26 NOTE — Progress Notes (Addendum)
 Patient ID: Racheal Mathurin, female   DOB: 1970/06/20, 53 y.o.   MRN: [id number]  HPI  Leann Mayweather is a 53 y.o.-year-old female, returning for f/u for metastatic papillary thyroid  cancer  and postsurgical hypothyroidism. Last visit 1 year ago. She also sees Dr. Megan Dimes with [facility] in [facility].  Interim history: She was found to have metastatic thyroid  cancer to the level 6 right lymph node and had lymph node dissection at [facility] (08/26/2022).  3/3 positive LNs were resected. She feels well at today's visit, but she does mention more fatigue and weight gain after we decreased her Armour dose.  Per our scale, she is +11 pounds since last visit. She had recent investigation for cervical bulging disks with an MRI.  There was an impression of axillary lymphadenopathy.  She had a subsequent chest CT which did not show any abnormality.  Reviewed and addended her cancer history: Pt. has been dx with metastatic papillary thyroid  cancer in 2009: 08/18/2007: Total thyroidectomy (Dr Eletha) Pathology:  1. THYROID , RIGHT LOBE, HEMITHYROIDECTOMY:  - PAPILLARY THYROID  CARCINOMA. (1.8 CM IN GREATEST DIMENSION).  - CHRONIC THYROIDITIS.  - ONE BENIGN LYMPH NODE (ISTHMUS).  - ONE BENIGN PARATHYROID GLAND IDENTIFIED.  - INKED MARGINS NEGATIVE FOR TUMOR.   2. LYMPH NODE, CENTRAL COMPARTMENT, RESECTION:  - 3 OF 7 LYMPH NODES WITH METASTATIC PAPILLARY THYROID  CARCINOMA.   3. THYROID , LEFT, HEMITHYROIDECTOMY:  - CHRONIC THYROIDITIS  09/21/2007: RAI treatment with 159 mCi I-131 09/28/2007: Whole body scan negative, except expected uptake in the thyroidectomy bed 02/09/2009: Whole body scan negative 04/04/2011: Whole body scan negative 06/11/2012: Whole body scan negative She developed posterior L neck pain >> whole-body scan was obtained on 05/04/2015 >> negative Thyroid  ultrasound (04/20/2018)-ordered by Dr. Nichole: No abnormal masses CT neck (02/09/2022):  Benign-appearing  thyroidectomy bed with symmetric benign-appearing cervical nodes. No foci of suspected metastatic disease. Whole-body scan with Thyrogen  (02/24/2022):  No scintigraphic evidence of iodine avid metastatic thyroid  cancer.  PET scan (04/18/2022): 4 mm hypermetabolic nodule in the right thyroidectomy bed, concerning for disease recurrence. No evidence of distant metastatic disease. Neck U/S (05/22/2022): Right lobe: Within the right thyroidectomy resection bed, there are 3 discrete punctate (subcentimeter) hypoechoic nodules, the largest of which measures approximately 0.9 x 0.6 x 0.6 cm (images 15 through 18), and likely correlates with the hypermetabolic nodule seen on preceding PET-CT. FNA (05/22/2022):  Suspicious for a follicular neoplasm (Bethesda category 4); Afirma: Suspicious Right level 6 lymph node dissection (Dr. Sibyl, [facility], 08/04/2022): A. Right level 6 superior lymph node, excision: Metastatic papillary thyroid  carcinoma in one lymph node (1/1) Size of metastasis: 4 mm Comment: Evaluation for extranodal extension is difficult to assess on histologic examination, as a definitive lymph node capsule is not identified and the sample is received fragmented.    B. Right level 6 inferior lymph node, excision: Metastatic papillary thyroid  carcinoma in two lymph nodes (2/2) Size of metastasis: Largest deposit measures up to 6 mm on a slide Negative for definitive extranodal extension. CT chest without contrast (12/23/2023): Mediastinum/Nodes: No hilar or mediastinal adenopathy. The esophagus is grossly unremarkable. Thyroidectomy. No mediastinal fluid collection.  Reviewed her thyroglobulin and ATA levels: 01/2014: Tg 2.1, ATA 6.0 (0-0.9), TSH 0.060 08/18/2014: Tg <2.0 (RIA), ATA 6.7, TSH 0.060 03/29/2015: Tg <2.0 (RIA), ATA 8.9, TSH 0.230 05/04/2015: ATA 11.0, TSH 13.11 (*patient believes that this was not correct)  09/09/2015: ATA 22.8 03/16/2018: Tg (RIA) 2.7, ATA 9.1 Component      Latest Ref Rng &  Units 03/23/2018  Thyroglobulin Antibody     0.0 - 0.9 IU/mL 10.2 (H)  Thyroglobulin by LCMS     1.5 - 38.5 ng/mL <0.2 (L)   Component     Latest Ref Rng & Units 01/18/2020 03/14/2020 07/13/2020  Thyroglobulin Antibody     0.0 - 0.9 IU/mL 17.5 (H) 15.6 (H) 11.2 (H)  Thyroglobulin by LCMS     1.5 - 38.5 ng/mL <0.2 (L) <0.2 (L) <0.2 (L)   07/22/2022:   Lab Results  Component Value Date   THYROGLB <2.0 07/06/2015   THYROGLB <0.1 (L) 06/08/2015   THGAB 12.4 (H) 01/21/2023   THGAB 20.3 (H) 01/17/2022   THGAB 13.6 (H) 01/17/2021   THGAB 11.2 (H) 07/13/2020   THGAB 15.6 (H) 03/14/2020   THGAB 17.5 (H) 01/18/2020   THGAB 11.6 (H) 01/18/2019   THGAB 10.2 (H) 03/23/2018   THGAB 11 (H) 06/08/2015   She developed postop hypothyroidism after her total thyroidectomy in 2009; (tried Synthroid  and did not help) -continuing on Armour. We varied the dose of Armour frequently in the past. In 07/2021, we increased the Armour dose to 90  + 15  = 105 mg alternating with 90 mg every other day In 01/2022, we increased the Armour dose to 105 mg daily In 01/2023, we decreased the Armour dose to 90 mg daily In 08/2023, we decreased the Armour dose to 75 mg daily  She takes this: - in am - Coffee + stevia, occasionally creamer - >1h later - fasting - at least 30 min from b'fast - no Ca, Fe, MVI, PPIs - not on Biotin  Reviewed her TFTs: 11/24/2022 (PCP): TSH 0.015 Lab Results  Component Value Date   TSH 0.01 (L) 08/26/2023   TSH 0.01 (L) 01/21/2023   TSH 1.25 01/17/2022   TSH 9.66 (H) 07/17/2021   TSH 0.03 (L) 01/17/2021   TSH 6.37 (H) 07/13/2020   TSH 0.98 03/14/2020   TSH 0.11 (L) 01/18/2020   TSH 0.01 (L) 07/18/2019   TSH 0.17 (L) 01/18/2019   FREET4 1.3 08/26/2023   FREET4 1.20 01/21/2023   FREET4 0.59 (L) 01/17/2022   FREET4 0.55 (L) 07/17/2021   FREET4 0.83 01/17/2021   FREET4 0.40 (L) 07/13/2020   FREET4 0.57 (L) 03/14/2020   FREET4 0.63 01/18/2020   FREET4  1.11 07/18/2019   FREET4 0.55 (L) 01/18/2019  12/24/2022 ([facility] medicine): TSH 0.008 (!) Received labs from PCP, from 09/21/2018: TSH 0.0 (0.4-4.2), free T4 1.1, total T3 213 (70-170).  Per review of the chart, she was seeing Dr. Nichole at that time. 03/16/2018: TSH 0.84 (0.4-4.2), fT4 0.7 (0.8-1.8)  Pt denies: - feeling nodules in neck - hoarseness - dysphagia - choking  She has + FH of thyroid  disorders in: mother. No FH of thyroid  cancer. No h/o radiation tx to head or neck. No herbal supplements. No Biotin use. No recent steroids use.   She saw Dr. Zackary in [place] -he started her on pregnenolone (?).  He previously added levothyroxine  to her Armour dose (?), but we stopped this.  Currently on Prometrium , but not taking Estrogen s-l consistently.  ROS: + See HPI  Past Medical History:  Diagnosis Date   Migraine aura, persistent    Thyroid  cancer [facility])    Past Surgical History:  Procedure Laterality Date   BREAST REDUCTION SURGERY  05/19/1990   CESAREAN SECTION     THYROID  SURGERY  07/2022   cancer   THYROIDECTOMY  05/20/2007   Social History  Social History   Marital Status: Married    Spouse Name: N/A   Number of Children: 1   Occupational History    self-employed, Research scientist (medical)    Social History Main Topics   Smoking status:  former smoker, quit in 1998    Smokeless tobacco: Not on file   Alcohol Use: Yes     Comment: ocasionnaly, vault,, wine    Drug Use: No   Sexual Activity: Yes    Birth Control/ Protection: Condom   Current Outpatient Medications on File Prior to Visit  Medication Sig Dispense Refill   ARMOUR THYROID  15 MG tablet Take 1 tablet (15 mg total) by mouth daily. 90 tablet 3   ARMOUR THYROID  60 MG tablet Take 1 tablet (60 mg total) by mouth daily before breakfast. 90 tablet 3   EMGALITY  120 MG/ML SOAJ  (Patient not taking: Reported on 01/21/2023)     lisdexamfetamine (VYVANSE ) 70 MG capsule Take 1 cap day in the AM. Oral Once a day  for 30 days     ondansetron  (ZOFRAN -ODT) 4 MG disintegrating tablet Take 1 tablet (4 mg total) by mouth every 8 (eight) hours as needed. 20 tablet 3   progesterone  (PROMETRIUM ) 100 MG capsule Take 100 mg by mouth at bedtime.     promethazine  (PHENERGAN ) 25 MG tablet Take 1 tablet (25 mg total) by mouth every 6 (six) hours as needed. 30 tablet 2   Testosterone  Enanthate POWD Take 2.5 mg by mouth daily.     UBRELVY  100 MG TABS Take 100 mg by mouth as needed (migranes).     No current facility-administered medications on file prior to visit.   Allergies  Allergen Reactions   Corn Oil     Other Reaction(s): Unknown   Gluten Meal     Other Reaction(s): Unknown   Wound Dressings Swelling    To Dermabond/surgical glue   Family History  Problem Relation Age of Onset   Hypertension Mother    Diabetes Maternal Grandmother    Cancer Maternal Grandmother        esopagus   Colon cancer Neg Hx    Colon polyps Neg Hx    Esophageal cancer Neg Hx    Stomach cancer Neg Hx    Rectal cancer Neg Hx    PE: BP 120/70   Pulse 89   Ht 5' 8 (1.727 m)   Wt 159 lb 6.4 oz (72.3 kg)   SpO2 97%   BMI 24.24 kg/m   Wt Readings from Last 3 Encounters:  01/26/24 159 lb 6.4 oz (72.3 kg)  03/09/23 148 lb (67.1 kg)  02/16/23 148 lb (67.1 kg)   Constitutional: normal weight, in NAD Eyes: no exophthalmos ENT: no masses palpated in neck, surgical scar healed with excellent aspect, no cervical lymphadenopathy Cardiovascular: RRR, No MRG Respiratory: CTA B Musculoskeletal: no deformities Skin:  no rashes Neurological: no tremor with outstretched hands  ASSESSMENT: 1. Papillary thyroid  cancer  2. Hypothyroidism  PLAN:  1.  Papillary thyroid  cancer, metastatic to the lymph nodes -In the past, she had a second opinion with Dr. Nichole and then returns to see me in 2022.  -She has a history of thyroidectomy by Dr. Eletha in 2019, followed by RAI treatment with 159 mCi in 09/2017.  She had a neck  ultrasound in 04/2018, which did not show abnormalities.  Of note, her thyroglobulin levels were elevated until 01/2014, but became undetectable afterwards.  However, her ATA antibodies are still elevated. -Due to the elevated  ATA antibodies, we checked a CT of her neck and this did not show any suspicious masses.  Thyrogen  stimulated whole-body scan did not show any uptake sites.  We next check a PET scan which showed a 4 mm focus of increased uptake in the right thyroid  fossa.  A thyroid  ultrasound showed 3 subcentimeter hypoechoic nodules in the right thyroid  fossa, with the largest measuring 0.9 cm and possibly corresponding to the increased uptake site on the PET scan.  We biopsied the nodule and this was suspicious for follicular neoplasm.  The Afirma molecular marker returned suspicious.  At that point, we referred her to Dr. Sibyl at [facility].  She had a comprehensive right liver 6 lymph node dissection.  She was found to have 3 out of 3 lymph nodes positive for metastasis (1 superior and 2 inferior).  It was unclear whether there was extrathyroidal extension in the superior level 6 compartment (could not be evaluated), but no extrathyroidal extension was found in the inferior level 6 compartment -At last visit, and again today we discussed about following her with thyroglobulin and especially ATA antibodies.  RAI treatment is not recommended since thyroglobulin level is undetectable.  At last visit, the ATA antibodies were lower, but still detectable.  Will recheck these today. -At today's visit, we reviewed the results of her recent chest CT: No abnormality seen in the thyroidectomy bed.  Will also repeat a neck ultrasound for more detailed evaluation. - RTC in 1 year  2. Patient with postop hypothyroidism  - Uncontrolled - latest thyroid  labs reviewed with pt. >> TSH was still suppressed on 11/24/2023: 0.015  - since last visit, we had to reduce the dose of her Armour from 105 to 90 to 75 mg daily -  her TFTs were previously also checked by integr[facility] medicine -she had a very suppressed TSH at 0.008 in 12/2022 and was told that this was 'at goal' - pt has more fatigue and also 11 pound weight gain since last visit.  She is wondering whether her Armour dose is adequate.  We discussed about side effects of hormone replacement with our on the cardiovascular and bone systems.  Discussed that our goal for TSH is close to the lower limit of the target range. - we discussed about taking the thyroid  hormone every day, with water , >30 minutes before breakfast, separated by >4 hours from acid reflux medications, calcium, iron, multivitamins. Pt. is taking it correctly. - will check thyroid  tests today: TSH, fT3, and fT4 - If labs are abnormal, she will need to return for repeat TFTs in 1.5 months  Orders Placed This Encounter  Procedures   US  THYROID    TSH   T4, free   Thyroglobulin antibody   Thyroglobulin Level   Component     Latest Ref Rng 01/26/2024  T4,Free(Direct)     0.8 - 1.8 ng/dL 1.3   TSH     mIU/L 9.97 (L)   Thyroglobulin Ab     < or = 1 IU/mL 10 (H)   Thyroglobulin     ng/mL <0.1 (L)   Comment -   Thyroglobulin is undetectable and thyroglobulin antibodies continue to improve. TSH is too suppressed, however, so we will need to back off Armour to 60 mg daily and recheck her TFTs in 1.5 months. I am wondering why she is requiring less and less Armour, especially since this is not explained by a drastic weight loss.  I will check with her to see if she is  receiving any thyroid  hormones from [facility]. Will plan to repeat her TFTs in 1.5 months.  Neck U/S (01/28/2024): Postsurgical changes after total thyroidectomy. Previously visualized soft tissue nodularity about the right thyroid  bed is no longer conspicuous. No regional or cervical lymphadenopathy.   IMPRESSION: Postsurgical changes after total thyroidectomy without evidence of residual nodularity in the  surgical bed. No regional or cervical lymphadenopathy.  Lela Fendt, MD PhD [facility]

## 2024-01-26 NOTE — Patient Instructions (Addendum)
 Please stop at the lab.  Please continue Armour 75 mg daily  Take the thyroid  hormone every day, with water , at least 30 minutes before breakfast, separated by at least 4 hours from: - acid reflux medications - calcium - iron - multivitamins  We will schedule a new neck U/S.  Please come back for a follow-up appointment in 1 year.

## 2024-01-28 ENCOUNTER — Ambulatory Visit
Admission: RE | Admit: 2024-01-28 | Discharge: 2024-01-28 | Disposition: A | Discharge status: Home / Self Care | Source: Ambulatory Visit | Attending: Internal Medicine | Admitting: Internal Medicine

## 2024-01-28 DIAGNOSIS — C73 Malignant neoplasm of thyroid gland: Secondary | ICD-10-CM

## 2024-01-28 LAB — T4, FREE: Free T4: 1.3 ng/dL (ref 0.8–1.8)

## 2024-01-28 LAB — THYROGLOBULIN LEVEL: Thyroglobulin: <0.1 ng/mL — ABNORMAL LOW

## 2024-01-28 LAB — TSH: TSH: 0.02 m[IU]/L — ABNORMAL LOW

## 2024-01-28 LAB — THYROGLOBULIN ANTIBODY: Thyroglobulin Ab: 10 [IU]/mL — ABNORMAL HIGH (ref <=1)

## 2024-01-29 ENCOUNTER — Ambulatory Visit: Payer: Self-pay | Admitting: Internal Medicine

## 2024-01-29 NOTE — Addendum Note (Signed)
 Addended by: TRIXIE FILE on: 01/29/2024 12:03 PM   Modules accepted: Orders

## 2025-01-31 ENCOUNTER — Ambulatory Visit: Admitting: Internal Medicine
